# Patient Record
Sex: Female | Born: 1935 | Race: White | Hispanic: No | Smoking: Former smoker
Health system: Southern US, Community
[De-identification: ages and names within clinical notes are randomized; demographics above are authoritative.]

## PROBLEM LIST (undated history)

## (undated) DIAGNOSIS — I2510 Atherosclerotic heart disease of native coronary artery without angina pectoris: Secondary | ICD-10-CM

## (undated) DIAGNOSIS — I255 Ischemic cardiomyopathy: Secondary | ICD-10-CM

## (undated) DIAGNOSIS — M1990 Unspecified osteoarthritis, unspecified site: Secondary | ICD-10-CM

## (undated) DIAGNOSIS — Z90710 Acquired absence of both cervix and uterus: Secondary | ICD-10-CM

## (undated) DIAGNOSIS — I10 Essential (primary) hypertension: Secondary | ICD-10-CM

## (undated) DIAGNOSIS — Z9079 Acquired absence of other genital organ(s): Secondary | ICD-10-CM

## (undated) DIAGNOSIS — Z90722 Acquired absence of ovaries, bilateral: Secondary | ICD-10-CM

## (undated) DIAGNOSIS — I340 Nonrheumatic mitral (valve) insufficiency: Secondary | ICD-10-CM

## (undated) DIAGNOSIS — I219 Acute myocardial infarction, unspecified: Secondary | ICD-10-CM

## (undated) DIAGNOSIS — Z87891 Personal history of nicotine dependence: Secondary | ICD-10-CM

## (undated) DIAGNOSIS — I509 Heart failure, unspecified: Secondary | ICD-10-CM

## (undated) DIAGNOSIS — E785 Hyperlipidemia, unspecified: Secondary | ICD-10-CM

## (undated) HISTORY — DX: Atherosclerotic heart disease of native coronary artery without angina pectoris: I25.10

## (undated) HISTORY — DX: Ischemic cardiomyopathy: I25.5

## (undated) HISTORY — PX: CAROTID STENT INSERTION: SHX5766

## (undated) HISTORY — DX: Acute myocardial infarction, unspecified: I21.9

## (undated) HISTORY — DX: Hyperlipidemia, unspecified: E78.5

## (undated) HISTORY — DX: Nonrheumatic mitral (valve) insufficiency: I34.0

## (undated) HISTORY — DX: Unspecified osteoarthritis, unspecified site: M19.90

## (undated) HISTORY — PX: TOTAL ABDOMINAL HYSTERECTOMY W/ BILATERAL SALPINGOOPHORECTOMY: SHX83

## (undated) HISTORY — DX: Personal history of nicotine dependence: Z87.891

## (undated) HISTORY — DX: Acquired absence of ovaries, bilateral: Z90.722

## (undated) HISTORY — DX: Essential (primary) hypertension: I10

## (undated) HISTORY — DX: Acquired absence of other genital organ(s): Z90.79

## (undated) HISTORY — DX: Acquired absence of both cervix and uterus: Z90.710

---

## 2002-09-26 ENCOUNTER — Ambulatory Visit (HOSPITAL_COMMUNITY): Admission: RE | Admit: 2002-09-26 | Discharge: 2002-09-26 | Payer: Self-pay | Admitting: Emergency Medicine

## 2002-09-26 ENCOUNTER — Encounter: Payer: Self-pay | Admitting: Emergency Medicine

## 2002-09-28 ENCOUNTER — Other Ambulatory Visit: Admission: RE | Admit: 2002-09-28 | Discharge: 2002-09-28 | Payer: Self-pay | Admitting: *Deleted

## 2002-10-05 ENCOUNTER — Ambulatory Visit (HOSPITAL_COMMUNITY): Admission: RE | Admit: 2002-10-05 | Discharge: 2002-10-05 | Payer: Self-pay | Admitting: *Deleted

## 2002-10-11 ENCOUNTER — Encounter (INDEPENDENT_AMBULATORY_CARE_PROVIDER_SITE_OTHER): Payer: Self-pay | Admitting: Specialist

## 2002-10-11 ENCOUNTER — Inpatient Hospital Stay (HOSPITAL_COMMUNITY): Admission: RE | Admit: 2002-10-11 | Discharge: 2002-10-13 | Payer: Self-pay | Admitting: *Deleted

## 2009-11-03 ENCOUNTER — Inpatient Hospital Stay (HOSPITAL_COMMUNITY): Admission: EM | Admit: 2009-11-03 | Discharge: 2009-11-07 | Payer: Self-pay | Admitting: Emergency Medicine

## 2009-11-03 ENCOUNTER — Ambulatory Visit: Payer: Self-pay | Admitting: Cardiology

## 2009-11-04 ENCOUNTER — Encounter (INDEPENDENT_AMBULATORY_CARE_PROVIDER_SITE_OTHER): Payer: Self-pay | Admitting: Cardiology

## 2009-11-06 ENCOUNTER — Encounter: Payer: Self-pay | Admitting: Cardiology

## 2009-11-08 ENCOUNTER — Encounter: Payer: Self-pay | Admitting: Cardiology

## 2009-11-14 ENCOUNTER — Encounter: Payer: Self-pay | Admitting: Cardiology

## 2009-11-28 DIAGNOSIS — I080 Rheumatic disorders of both mitral and aortic valves: Secondary | ICD-10-CM

## 2009-11-28 DIAGNOSIS — I5021 Acute systolic (congestive) heart failure: Secondary | ICD-10-CM | POA: Insufficient documentation

## 2009-11-28 DIAGNOSIS — I214 Non-ST elevation (NSTEMI) myocardial infarction: Secondary | ICD-10-CM | POA: Insufficient documentation

## 2009-11-28 DIAGNOSIS — M1990 Unspecified osteoarthritis, unspecified site: Secondary | ICD-10-CM | POA: Insufficient documentation

## 2009-11-28 DIAGNOSIS — Z87891 Personal history of nicotine dependence: Secondary | ICD-10-CM

## 2009-11-28 DIAGNOSIS — I10 Essential (primary) hypertension: Secondary | ICD-10-CM

## 2009-11-28 DIAGNOSIS — I2589 Other forms of chronic ischemic heart disease: Secondary | ICD-10-CM

## 2009-11-29 ENCOUNTER — Ambulatory Visit: Payer: Self-pay | Admitting: Cardiology

## 2009-11-29 DIAGNOSIS — I2510 Atherosclerotic heart disease of native coronary artery without angina pectoris: Secondary | ICD-10-CM

## 2009-11-29 DIAGNOSIS — E785 Hyperlipidemia, unspecified: Secondary | ICD-10-CM

## 2009-11-29 LAB — CONVERTED CEMR LAB
Calcium: 9.6 mg/dL (ref 8.4–10.5)
Creatinine, Ser: 0.9 mg/dL (ref 0.4–1.2)
Pro B Natriuretic peptide (BNP): 148.5 pg/mL — ABNORMAL HIGH (ref 0.0–100.0)

## 2009-12-13 ENCOUNTER — Ambulatory Visit: Payer: Self-pay | Admitting: Cardiology

## 2009-12-17 LAB — CONVERTED CEMR LAB
Calcium: 9.3 mg/dL (ref 8.4–10.5)
GFR calc non Af Amer: 54.02 mL/min (ref >60)
Sodium: 140 meq/L (ref 135–145)

## 2009-12-26 ENCOUNTER — Ambulatory Visit: Payer: Self-pay | Admitting: Cardiology

## 2010-01-09 ENCOUNTER — Ambulatory Visit: Payer: Self-pay | Admitting: Cardiology

## 2010-01-18 ENCOUNTER — Encounter: Payer: Self-pay | Admitting: Cardiology

## 2010-01-18 LAB — CONVERTED CEMR LAB
ALT: 17 units/L (ref 0–35)
Albumin: 3.9 g/dL (ref 3.5–5.2)
Alkaline Phosphatase: 62 units/L (ref 39–117)
Chloride: 103 meq/L (ref 96–112)
Cholesterol: 132 mg/dL (ref 0–200)
Creatinine, Ser: 1 mg/dL (ref 0.4–1.2)
LDL Cholesterol: 67 mg/dL (ref 0–99)
Potassium: 4.3 meq/L (ref 3.5–5.1)
Total Protein: 6.2 g/dL (ref 6.0–8.3)
Triglycerides: 137 mg/dL (ref 0.0–149.0)

## 2010-02-20 ENCOUNTER — Ambulatory Visit: Payer: Self-pay | Admitting: Cardiology

## 2010-02-20 ENCOUNTER — Ambulatory Visit (HOSPITAL_COMMUNITY): Admission: RE | Admit: 2010-02-20 | Discharge: 2010-02-20 | Payer: Self-pay | Admitting: Cardiology

## 2010-02-20 ENCOUNTER — Ambulatory Visit: Payer: Self-pay

## 2010-03-27 ENCOUNTER — Ambulatory Visit: Payer: Self-pay | Admitting: Cardiology

## 2010-04-02 LAB — CONVERTED CEMR LAB
Calcium: 9.3 mg/dL (ref 8.4–10.5)
GFR calc non Af Amer: 53.99 mL/min (ref >60)
Glucose, Bld: 88 mg/dL (ref 70–99)
Potassium: 4.6 meq/L (ref 3.5–5.1)
Sodium: 137 meq/L (ref 135–145)

## 2010-07-09 NOTE — Assessment & Plan Note (Signed)
Summary: per check out/sf   Primary Provider:  Dr. Cleta Alberts  CC:  follow up.  Pt states she is feeling okay.  Cardiac rehab starting in September.  History of Present Illness: 77 yo with history of recent late-presentation anterior MI presents for hospital followup.  Patient was admitted in 5/11 after having about 2 weeks of on and off severe chest pain.  She was found to have a 95% proximal LAD stenosis on cath with akinetic myocardium in the LAD distribution.  She had a PROMUS DES to the LAD and was treated for systolic CHF.  Since going back home, she has been doing well.  She is living alone again though her family lives close and checks in.  She is doing her shopping, cooking, and cleaning.  She walks for about 20 minutes a day. She denies shortness of breath with these activities.  She has had no further chest pain.  She plans to start cardiac rehab in 9/11 after a family vacation. BP is mildly elevated today but has been running in the 110s-130s systolic at home.    Labs (6/11): K 4.7, creatinine 0.9, LDL 128, HDL 38, BNP 149 Labs (7/11): K 4.7, creatinine 1.0  ECG: NSR at 57, TW inversions V1 and V2  Current Medications (verified): 1)  Carvedilol 12.5 Mg Tabs (Carvedilol) .... Take One Tablet By Mouth Twice A Day 2)  Plavix 75 Mg Tabs (Clopidogrel Bisulfate) .... Take One Tablet By Mouth Daily 3)  Enalapril Maleate 5 Mg Tabs (Enalapril Maleate) .... One and One Half Tablet By Mouth Two Times A Day 4)  Furosemide 20 Mg Tabs (Furosemide) .Marland Kitchen.. 1 Tab By Mouth Every Other Day 5)  Nitrostat 0.4 Mg Subl (Nitroglycerin) .Marland Kitchen.. 1 Tablet Under Tongue At Onset of Chest Pain; You May Repeat Every 5 Minutes For Up To 3 Doses. 6)  Potassium Chloride Crys Cr 20 Meq Cr-Tabs (Potassium Chloride Crys Cr) .... 1/2 Tab By Mouth Once Daily 7)  Spironolactone 25 Mg Tabs (Spironolactone) .... Take One Tablet By Mouth Daily 8)  Aspirin Ec 325 Mg Tbec (Aspirin) .... Take One Tablet By Mouth Daily 9)  Crestor 20 Mg  Tabs (Rosuvastatin Calcium) .... Take One Tablet By Mouth Daily.  Allergies (verified): No Known Drug Allergies  Past History:  Past Medical History: Reviewed history from 11/29/2009 and no changes required. 1. CAD: Patient had an out of hospital (late presentation) anterior MI in 5/11.  LHC showed 95% proximal LAD stenosis.  Patient had a PROMUS DES to the LAD.  2. Ischemic cardiomyopathy: Echo (5/11) showed EF 30% with anterior, anteroseptal, inferoseptal, and apex akinesis.  There was restrictive diastolic function, moderate mitral regurgitation, and PA systolic pressure 59 mmHg.   3. Remote tobacco abuse.  4. Hypertension.  5. Osteoarthritis  6. Moderate mitral regurgitation by echo 5/11.  7. Hyperlipidemia 8. s/p TAH-BSO  Family History: Reviewed history from 11/28/2009 and no changes required.  Noncontributory.   Social History: Reviewed history from 11/29/2009 and no changes required. The patient lives in [place] at [place].  She is widowed and lives alone though her children are near.  .  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  She does not  drink any alcohol.   Review of Systems       All systems reviewed and negative except as per HPI.   Vital Signs:  Patient profile:   77 year old female Height:      99  inches Weight:      124 pounds BMI:     20.09 Pulse rate:   57 / minute Pulse rhythm:   regular BP sitting:   142 / 70  (left arm) Cuff size:   regular  Vitals Entered By: Judithe Modest CMA (December 26, 2009 9:24 AM)  Physical Exam  General:  Elderly woman in no apparent distress Neck:  Neck supple, no JVD. No masses, thyromegaly or abnormal cervical nodes. Lungs:  Clear bilaterally to auscultation and percussion. Heart:  Non-displaced PMI, chest non-tender; regular rate and rhythm, S1, S2 without murmurs, rubs. +S4. Carotid upstroke normal, no bruit.  Pedals normal pulses. No edema, no varicosities. Abdomen:  Bowel sounds  positive; abdomen soft and non-tender without masses, organomegaly, or hernias noted. No hepatosplenomegaly. Extremities:  No clubbing or cyanosis. Neurologic:  Alert and oriented x 3. Psych:  Normal affect.   Impression & Recommendations:  Problem # 1:  CAD, NATIVE VESSEL (ICD-414.01) Status post late presentation anterior MI with resulting akinetic myocardium in the LAD distribution.  S/p PROMUS DES to the LAD.  No chest pain since PCI.  She seems to be doing well at home.  - She will start cardiac rehab after a family vacation to the beach.  - Continue ASA, Plavix, statin, beta blocker, ACEI  Problem # 2:  HYPERLIPIDEMIA-MIXED (ICD-272.4) Lipids/LFTs in 8/11 with goal LDL < 70.   Problem # 3:  ISCHEMIC CARDIOMYOPATHY (ICD-414.8) EF 30% by echo in hospital.  Today she appears euvolemic with NYHA II symptoms.  Continue current Lasix and spironolactone.  She can stop KCl.  I will increase enalapril to 10 mg bid.  BMET in 2 wks. Continue Coreg and spironolactone.   Echo in 3 months post-PCI for LV function on meds.  Would avoid ICD given age.   Patient Instructions: 1)  Your physician has recommended you make the following change in your medication:  2)  Increase Enalapril to 10mg  twice a day--you can take two 5mg  tablets twice a day. 3)  Stop Potassium(KCL). 4)  Your physician recommends that you return for a FASTING lipid profile/liver profile/BMP in 2 weeks--414.01 428.22  5)  Your physician has requested that you have an echocardiogram.  Echocardiography is a painless test that uses sound waves to create images of your heart. It provides your doctor with information about the size and shape of your heart and how well your heart's chambers and valves are working.  This procedure takes approximately one hour. There are no restrictions for this procedure. February 20, 2010 11:30 am 6)  Your physician wants you to follow-up in: 3 months with Dr Shirlee Latch. You will receive a reminder letter in  the mail two months in advance. If you don't receive a letter, please call our office to schedule the follow-up appointment. Prescriptions: ENALAPRIL MALEATE 10 MG TABS (ENALAPRIL MALEATE) one twice a day  #60 x 11   Entered by:   Katina Dung, RN, BSN   Authorized by:   Marca Ancona, MD   Signed by:   Katina Dung, RN, BSN on 12/26/2009   Method used:   Electronically to        [facility]. [phone]* (retail)       [address]       [place], [place]  [address]       Ph: [phone]       Fax: [phone]   RxID:   [id number]

## 2010-07-09 NOTE — Cardiovascular Report (Signed)
Summary: [facility]   Imported By: Roderic Ovens 12/04/2009 14:05:27  _____________________________________________________________________  External Attachment:    Type:   Image     Comment:   External Document

## 2010-07-09 NOTE — Assessment & Plan Note (Signed)
Summary: f19m   Visit Type:  Follow-up Primary Provider:  Dr. Cleta Alberts  CC:  no complaints.  History of Present Illness: 77 yo with history of recent late-presentation anterior MI presents for hospital followup.  Patient was admitted in 5/11 after having about 2 weeks of on and off severe chest pain.  She was found to have a 95% proximal LAD stenosis on cath with akinetic myocardium in the LAD distribution.  She had a PROMUS DES to the LAD and was treated for systolic CHF.    Since going back home, she has been doing well.  She is living alone though her family lives close and checks in.  She is doing her shopping, cooking, and cleaning.  She does all the housework as well as some yardwork. She denies shortness of breath with these activities.  She goes grocery shopping and drives.  She is somewhat limited by ankle and knee pain.  Repeat echo last month showed that EF had normalized.   Labs (6/11): K 4.7, creatinine 0.9, LDL 128, HDL 38, BNP 149 Labs (7/11): K 4.7, creatinine 1.0 Labs (8/11): K 4.3, creatinine 1.0, LDL 67, HDL 38, LFTs normal  Current Medications (verified): 1)  Carvedilol 12.5 Mg Tabs (Carvedilol) .... Take One Tablet By Mouth Twice A Day 2)  Plavix 75 Mg Tabs (Clopidogrel Bisulfate) .... Take One Tablet By Mouth Daily 3)  Enalapril Maleate 10 Mg Tabs (Enalapril Maleate) .... One Twice A Day 4)  Furosemide 20 Mg Tabs (Furosemide) .Marland Kitchen.. 1 Tab By Mouth Every Other Day 5)  Nitrostat 0.4 Mg Subl (Nitroglycerin) .Marland Kitchen.. 1 Tablet Under Tongue At Onset of Chest Pain; You May Repeat Every 5 Minutes For Up To 3 Doses. 6)  Spironolactone 25 Mg Tabs (Spironolactone) .... Take One Tablet By Mouth Daily 7)  Aspirin Ec 325 Mg Tbec (Aspirin) .... Take One Tablet By Mouth Daily 8)  Crestor 20 Mg Tabs (Rosuvastatin Calcium) .... Take One Tablet By Mouth Daily.  Allergies (verified): No Known Drug Allergies  Past History:  Past Medical History: 1. CAD: Patient had an out of hospital (late  presentation) anterior MI in 5/11.  LHC showed 95% proximal LAD stenosis.  Patient had a PROMUS DES to the LAD.  2. Ischemic cardiomyopathy: Echo (5/11) showed EF 30% with anterior, anteroseptal, inferoseptal, and apex akinesis.  There was restrictive diastolic function, moderate mitral regurgitation, and PA systolic pressure 59 mmHg.  Repeat echo (9/11) with EF 60%, mild LVH, no regional wall motion abnormalities, PA systolic pressure 32 mmHg, mild MR.   3. Remote tobacco abuse.  4. Hypertension.  5. Osteoarthritis  6. Moderate mitral regurgitation by echo 5/11.  7. Hyperlipidemia 8. s/p TAH-BSO  Family History: Reviewed history from 11/28/2009 and no changes required.  Noncontributory.   Social History: Reviewed history from 11/29/2009 and no changes required. The patient lives in [place] at [place].  She is widowed and lives alone though her children are near.  .  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  She does not  drink any alcohol.   Vital Signs:  Patient profile:   77 year old female Height:      66 inches Weight:      124 pounds BMI:     20.09 Pulse rate:   72 / minute Pulse rhythm:   regular BP sitting:   140 / 68  (right arm) Cuff size:   regular  Vitals Entered By: Hardin Negus, RMA (March 27, 2010 10:29 AM)  Physical Exam  General:  Elderly woman in no apparent distress Neck:  Neck supple, no JVD. No masses, thyromegaly or abnormal cervical nodes. Lungs:  Clear bilaterally to auscultation and percussion. Heart:  Non-displaced PMI, chest non-tender; regular rate and rhythm, S1, S2 without murmurs, rubs. +S4. Carotid upstroke normal, no bruit.  Pedals normal pulses. No edema, no varicosities. Abdomen:  Bowel sounds positive; abdomen soft and non-tender without masses, organomegaly, or hernias noted. No hepatosplenomegaly. Extremities:  No clubbing or cyanosis. Neurologic:  Alert and oriented x 3. Psych:  Normal  affect.   Impression & Recommendations:  Problem # 1:  CAD, NATIVE VESSEL (ICD-414.01) Status post late presentation anterior MI with resulting akinetic myocardium in the LAD distribution.  S/p PROMUS DES to the LAD.  No chest pain since PCI.  She seems to be doing well at home.  - She is now thinking about doing cardiac rehab: I will try to get her set up with this.  - Continue ASA, Plavix (for 1 year), statin, beta blocker, ACEI  Problem # 2:  CONGESTIVE HEART FAILURE, ACUTE, SYSTOLIC (ICD-428.21) Followup echo showed normalized EF.  She is doing well symptomatically as well.  Continue current meds for now.  BMET today.   Problem # 3:  HYPERLIPIDEMIA-MIXED (ICD-272.4) LDL at goal (< 70).   Other Orders: TLB-BMP (Basic Metabolic Panel-BMET) (80048-METABOL)  Patient Instructions: 1)  Your physician recommends that you schedule a follow-up appointment in: 6 months 2)  Your physician recommends that you return for lab work in:  today--BMET

## 2010-07-09 NOTE — Letter (Signed)
Summary: Custom - Lipid  [facility], Main Office  [address]   [place], [place] [address]   Phone: [phone]  Fax: [phone]     January 18, 2010 MRN: [id number]   [facility] [address] [place], [place]  [address]   Dear Ms. Michna,  We have reviewed your cholesterol results.  They are as follows:     Total Cholesterol:    132 (Desirable: less than 200)       HDL  Cholesterol:     37.70  (Desirable: greater than 40 for men and 50 for women)       LDL Cholesterol:       67  (Desirable: less than 100 for low risk and less than 70 for moderate to high risk)       Triglycerides:       137.0  (Desirable: less than 150)  Our recommendations include: these results have been reviewed by Dr Shirlee Latch and there are no new recommendations.   Call our office at the number listed above if you have any questions.  Lowering your LDL cholesterol is important, but it is only one of a large number of "risk factors" that may indicate that you are at risk for heart disease, stroke or other complications of hardening of the arteries.  Other risk factors include:   A.  Cigarette Smoking* B.  High Blood Pressure* C.  Obesity* D.   Low HDL Cholesterol (see yours above)* E.   Diabetes Mellitus (higher risk if your is uncontrolled) F.  Family history of premature heart disease G.  Previous history of stroke or cardiovascular disease    *These are risk factors YOU HAVE CONTROL OVER.  For more information, visit .  There is now evidence that lowering the TOTAL CHOLESTEROL AND LDL CHOLESTEROL can reduce the risk of heart disease.  The American Heart Association recommends the following guidelines for the treatment of elevated cholesterol:  1.  If there is now current heart disease and less than two risk factors, TOTAL CHOLESTEROL should be less than 200 and LDL CHOLESTEROL should be less than 100. 2.  If there is current heart disease or two or more risk factors,  TOTAL CHOLESTEROL should be less than 200 and LDL CHOLESTEROL should be less than 70.  A diet low in cholesterol, saturated fat, and calories is the cornerstone of treatment for elevated cholesterol.  Cessation of smoking and exercise are also important in the management of elevated cholesterol and preventing vascular disease.  Studies have shown that 30 to 60 minutes of physical activity most days can help lower blood pressure, lower cholesterol, and keep your weight at a healthy level.  Drug therapy is used when cholesterol levels do not respond to therapeutic lifestyle changes (smoking cessation, diet, and exercise) and remains unacceptably high.  If medication is started, it is important to have you levels checked periodically to evaluate the need for further treatment options.  Thank you,    [employer] Team

## 2010-07-09 NOTE — Miscellaneous (Signed)
Summary: Medical Referral/Physician Order  Medical Referral/Physician Order   Imported By: Erle Crocker 11/22/2009 07:48:19  _____________________________________________________________________  External Attachment:    Type:   Image     Comment:   External Document

## 2010-07-09 NOTE — Miscellaneous (Signed)
Summary: [facility] Cardiac Progress Note   [facility] Cardiac [facility]   Imported By: Roderic Ovens 11/26/2009 14:05:50  _____________________________________________________________________  External Attachment:    Type:   Image     Comment:   External Document

## 2010-07-09 NOTE — Assessment & Plan Note (Signed)
Summary: EPH/JML   Primary Provider:  Dr. Cleta Alberts  CC:  follow hospital visit.  History of Present Illness: 77 yo with history of recent late-presentation anterior MI presents for hospital followup.  Patient was admitted in 5/11 after having about 2 weeks of on and off severe chest pain.  She was found to have a 95% proximal LAD stenosis on cath with akinetic myocardium in the LAD distribution.  She had a PROMUS DES to the LAD and was treated for systolic CHF.  Since going back home, she has been doing well.  She is living alone again though her family lives close and checks in.  She is doing her shopping, cooking, and cleaning.  She walks for about 20 minutes a day. She denies shortness of breath with these activities.  She has had no further chest pain.    Labs (6/11): K 3.8, creatinine 0.89, LDL 128, HDL 38  Current Medications (verified): 1)  Carvedilol 6.25 Mg Tabs (Carvedilol) .Marland Kitchen.. 1 1/2 Tab By Mouth Two Times A Day 2)  Plavix 75 Mg Tabs (Clopidogrel Bisulfate) .... Take One Tablet By Mouth Daily 3)  Enalapril Maleate 5 Mg Tabs (Enalapril Maleate) .... Take One Tablet By Mouth Twice A Day 4)  Furosemide 20 Mg Tabs (Furosemide) .Marland Kitchen.. 1 Tab By Mouth Every Other Day 5)  Nitrostat 0.4 Mg Subl (Nitroglycerin) .Marland Kitchen.. 1 Tablet Under Tongue At Onset of Chest Pain; You May Repeat Every 5 Minutes For Up To 3 Doses. 6)  Potassium Chloride Crys Cr 20 Meq Cr-Tabs (Potassium Chloride Crys Cr) .... 1/2 Tab By Mouth Once Daily 7)  Spironolactone 25 Mg Tabs (Spironolactone) .... Take One Tablet By Mouth Daily 8)  Aspirin Ec 325 Mg Tbec (Aspirin) .... Take One Tablet By Mouth Daily 9)  Crestor 20 Mg Tabs (Rosuvastatin Calcium) .... Take One Tablet By Mouth Daily.  Past History:  Past Medical History: 1. CAD: Patient had an out of hospital (late presentation) anterior MI in 5/11.  LHC showed 95% proximal LAD stenosis.  Patient had a PROMUS DES to the LAD.  2. Ischemic cardiomyopathy: Echo (5/11) showed EF  30% with anterior, anteroseptal, inferoseptal, and apex akinesis.  There was restrictive diastolic function, moderate mitral regurgitation, and PA systolic pressure 59 mmHg.   3. Remote tobacco abuse.  4. Hypertension.  5. Osteoarthritis  6. Moderate mitral regurgitation by echo 5/11.  7. Hyperlipidemia 8. s/p TAH-BSO  Family History: Reviewed history from 11/28/2009 and no changes required.  Noncontributory.   Social History: The patient lives in [place] at [place].  She is widowed and lives alone though her children are near.  .  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  She does not  drink any alcohol.   Review of Systems       All systems reviewed and negative except as per HPI.   Vital Signs:  Patient profile:   77 year old female Height:      66 inches Weight:      124 pounds BMI:     20.09 Pulse rate:   72 / minute Resp:     14 per minute BP sitting:   152 / 80  (left arm)  Vitals Entered By: Kem Parkinson (November 29, 2009 9:11 AM)  Physical Exam  General:  Elderly woman in no apparent distress Head:  normocephalic and atraumatic Nose:  no deformity, discharge, inflammation, or lesions Mouth:  Teeth, gums and palate normal. Oral mucosa  normal. Neck:  Neck supple, no JVD. No masses, thyromegaly or abnormal cervical nodes. Lungs:  Clear bilaterally to auscultation and percussion. Heart:  Non-displaced PMI, chest non-tender; regular rate and rhythm, S1, S2 without murmurs, rubs. +S4. Carotid upstroke normal, no bruit.  Pedals normal pulses. No edema, no varicosities. Abdomen:  Bowel sounds positive; abdomen soft and non-tender without masses, organomegaly, or hernias noted. No hepatosplenomegaly. Extremities:  No clubbing or cyanosis. Neurologic:  Alert and oriented x 3. Skin:  Intact without lesions or rashes. Psych:  Normal affect.   Impression & Recommendations:  Problem # 1:  CAD, NATIVE VESSEL (ICD-414.01) Status post late  presentation anterior MI with resulting akinetic myocardium in the LAD distribution.  S/p PROMUS DES to the LAD.  No chest pain since PCI.  She seems to be doing well at home.  - I encouraged her to do cardiac rehab - Continue ASA, Plavix, statin, beta blocker, ACEI  Problem # 2:  ISCHEMIC CARDIOMYOPATHY (ICD-414.8) EF 30% by echo in hospital.  Today she appears euvolemic with NYHA II symptoms.  Continue current Lasix and spironolactone.  Will increase Coreg to 12.5 mg two times a day and enalapril to 7.5 mg two times a day.  BMET in 2 wks.  Echo in 3 months post-PCI for LV function.  ? ICD if EF < 35%.   Problem # 3:  HYPERTENSION (ICD-401.9) Elevated BP.  Will increase Coreg and enalapril as above.  BP check in 2 wks.   Problem # 4:  HYPERLIPIDEMIA-MIXED (ICD-272.4) Lipids/LFTs in 8/11 with goal LDL < 70.   Other Orders: Cardiac Rehabilitation (Cardiac Rehab) Echocardiogram (Echo) TLB-BMP (Basic Metabolic Panel-BMET) (80048-METABOL) TLB-BNP (B-Natriuretic Peptide) (83880-BNPR)  Patient Instructions: 1)  Your physician recommends that you schedule a follow-up appointment in: ONE MONTH 2)  Your physician recommends that you return for lab work in:2 WEEKS-BMP-428.22/V58.69 3)  Your physician has recommended you make the following change in your medication: INCREASE CARVEDALOL 12.5MG  TWICE DAILY 4)  INCREASE ENALAPRIL 5MG  ONE AND ONE HALF TABLET TWICE DAILY 5)  Your physician has requested that you have an echocardiogram.  Echocardiography is a painless test that uses sound waves to create images of your heart. It provides your doctor with information about the size and shape of your heart and how well your heart's chambers and valves are working.  This procedure takes approximately one hour. There are no restrictions for this procedure.SCHEDULE IN 3 MONTHS 6)  CARDIAC REHAB Prescriptions: ENALAPRIL MALEATE 5 MG TABS (ENALAPRIL MALEATE) ONE AND ONE HALF TABLET by mouth two times a day  #90 x  12   Entered by:   Deliah Goody, RN   Authorized by:   Marca Ancona, MD   Signed by:   Deliah Goody, RN on 11/29/2009   Method used:   Electronically to        [facility]. [phone]* (retail)       [address]       [place], [place]  [address]       Ph: [phone]       Fax: [phone]   RxID:   [id number] CARVEDILOL 12.5 MG TABS (CARVEDILOL) Take one tablet by mouth twice a day  #60 x 12   Entered by:   Deliah Goody, RN   Authorized by:   Marca Ancona, MD   Signed by:   Deliah Goody, RN on 11/29/2009   Method used:   Electronically to  [facility] [address] [phone]* (retail)       [address]       [place], [place]  [address]       Ph: [phone]       Fax: [phone]   RxID:   [id number]

## 2010-08-26 LAB — BASIC METABOLIC PANEL WITH GFR
BUN: 18 mg/dL (ref 6–23)
CO2: 24 meq/L (ref 19–32)
Calcium: 9 mg/dL (ref 8.4–10.5)
Chloride: 110 meq/L (ref 96–112)
Creatinine, Ser: 0.89 mg/dL (ref 0.4–1.2)
GFR calc Af Amer: >60 mL/min (ref >60)
GFR calc non Af Amer: >60 mL/min (ref >60)
Glucose, Bld: 95 mg/dL (ref 70–99)
Potassium: 3.8 meq/L (ref 3.5–5.1)
Sodium: 139 meq/L (ref 135–145)

## 2010-08-26 LAB — CBC
HCT: 36.9 % (ref 36.0–46.0)
HCT: 38.6 % (ref 36.0–46.0)
HCT: 40.8 % (ref 36.0–46.0)
Hemoglobin: 12.4 g/dL (ref 12.0–15.0)
Hemoglobin: 13.3 g/dL (ref 12.0–15.0)
Hemoglobin: 13.4 g/dL (ref 12.0–15.0)
MCHC: 33.5 g/dL (ref 30.0–36.0)
MCHC: 34.5 g/dL (ref 30.0–36.0)
MCHC: 34.6 g/dL (ref 30.0–36.0)
MCV: 93.4 fL (ref 78.0–100.0)
MCV: 93.4 fL (ref 78.0–100.0)
Platelets: 208 K/uL (ref 150–400)
Platelets: 207 10*3/uL (ref 150–400)
Platelets: 212 10*3/uL (ref 150–400)
Platelets: 217 10*3/uL (ref 150–400)
RBC: 3.96 MIL/uL (ref 3.87–5.11)
RBC: 4.14 MIL/uL (ref 3.87–5.11)
RBC: 4.37 MIL/uL (ref 3.87–5.11)
RDW: 15.1 % (ref 11.5–15.5)
RDW: 14.6 % (ref 11.5–15.5)
RDW: 15 % (ref 11.5–15.5)
RDW: 15.1 % (ref 11.5–15.5)
WBC: 8.8 K/uL (ref 4.0–10.5)
WBC: 10.2 10*3/uL (ref 4.0–10.5)
WBC: 9.9 10*3/uL (ref 4.0–10.5)

## 2010-08-26 LAB — BASIC METABOLIC PANEL
BUN: 15 mg/dL (ref 6–23)
BUN: 20 mg/dL (ref 6–23)
CO2: 26 mEq/L (ref 19–32)
CO2: 28 mEq/L (ref 19–32)
CO2: 29 mEq/L (ref 19–32)
Calcium: 8.7 mg/dL (ref 8.4–10.5)
Calcium: 9 mg/dL (ref 8.4–10.5)
Calcium: 9 mg/dL (ref 8.4–10.5)
Calcium: 9 mg/dL (ref 8.4–10.5)
Creatinine, Ser: 0.82 mg/dL (ref 0.4–1.2)
Creatinine, Ser: 1 mg/dL (ref 0.4–1.2)
GFR calc Af Amer: >60 mL/min (ref >60)
GFR calc Af Amer: >60 mL/min (ref >60)
GFR calc non Af Amer: >60 mL/min (ref >60)
GFR calc non Af Amer: >60 mL/min (ref >60)
Glucose, Bld: 100 mg/dL — ABNORMAL HIGH (ref 70–99)
Glucose, Bld: 105 mg/dL — ABNORMAL HIGH (ref 70–99)
Glucose, Bld: 128 mg/dL — ABNORMAL HIGH (ref 70–99)
Glucose, Bld: 98 mg/dL (ref 70–99)
Potassium: 3.3 mEq/L — ABNORMAL LOW (ref 3.5–5.1)
Sodium: 141 mEq/L (ref 135–145)

## 2010-08-26 LAB — POCT CARDIAC MARKERS
Myoglobin, poc: 58.1 ng/mL (ref 12–200)
Myoglobin, poc: 71.7 ng/mL (ref 12–200)

## 2010-08-26 LAB — POCT I-STAT, CHEM 8
BUN: 13 mg/dL (ref 6–23)
Calcium, Ion: 0.82 mmol/L — ABNORMAL LOW (ref 1.12–1.32)
Creatinine, Ser: 0.8 mg/dL (ref 0.4–1.2)
Hemoglobin: 14.3 g/dL (ref 12.0–15.0)
Sodium: 140 mEq/L (ref 135–145)
TCO2: 25 mmol/L (ref 0–100)

## 2010-08-26 LAB — CARDIAC PANEL(CRET KIN+CKTOT+MB+TROPI)
CK, MB: 3.6 ng/mL (ref 0.3–4.0)
CK, MB: 4.6 ng/mL — ABNORMAL HIGH (ref 0.3–4.0)
CK, MB: 6.2 ng/mL (ref 0.3–4.0)
Relative Index: INVALID (ref 0.0–2.5)
Relative Index: INVALID (ref 0.0–2.5)
Total CK: 64 U/L (ref 7–177)
Total CK: 67 U/L (ref 7–177)
Total CK: 73 U/L (ref 7–177)

## 2010-08-26 LAB — POCT I-STAT 3, ART BLOOD GAS (G3+)
Acid-Base Excess: 6 mmol/L — ABNORMAL HIGH (ref 0.0–2.0)
O2 Saturation: 92 %
pO2, Arterial: 56 mmHg — ABNORMAL LOW (ref 80.0–100.0)

## 2010-08-26 LAB — LIPID PANEL
LDL Cholesterol: 128 mg/dL — ABNORMAL HIGH (ref 0–99)
Triglycerides: 100 mg/dL (ref <150)
VLDL: 20 mg/dL (ref 0–40)

## 2010-08-26 LAB — DIFFERENTIAL
Basophils Relative: 0 % (ref 0–1)
Eosinophils Absolute: 0 10*3/uL (ref 0.0–0.7)
Eosinophils Relative: 0 % (ref 0–5)
Monocytes Relative: 6 % (ref 3–12)
Neutrophils Relative %: 83 % — ABNORMAL HIGH (ref 43–77)

## 2010-08-26 LAB — CK TOTAL AND CKMB (NOT AT ARMC)
CK, MB: 2.6 ng/mL (ref 0.3–4.0)
Relative Index: INVALID (ref 0.0–2.5)
Total CK: 72 U/L (ref 7–177)

## 2010-08-26 LAB — MRSA PCR SCREENING: MRSA by PCR: NEGATIVE

## 2010-08-26 LAB — POCT I-STAT 3, VENOUS BLOOD GAS (G3P V)
Acid-Base Excess: 2 mmol/L (ref 0.0–2.0)
O2 Saturation: 60 %
pCO2, Ven: 36.4 mmHg — ABNORMAL LOW (ref 45.0–50.0)

## 2010-08-26 LAB — MAGNESIUM: Magnesium: 2.1 mg/dL (ref 1.5–2.5)

## 2010-08-26 LAB — PROTIME-INR
INR: 1.12 (ref 0.00–1.49)
Prothrombin Time: 13.6 seconds (ref 11.6–15.2)
Prothrombin Time: 14.3 seconds (ref 11.6–15.2)

## 2010-08-26 LAB — BRAIN NATRIURETIC PEPTIDE: Pro B Natriuretic peptide (BNP): 1270 pg/mL — ABNORMAL HIGH (ref 0.0–100.0)

## 2010-08-26 LAB — HEPARIN LEVEL (UNFRACTIONATED): Heparin Unfractionated: 0.42 IU/mL (ref 0.30–0.70)

## 2010-08-26 LAB — APTT: aPTT: 29 seconds (ref 24–37)

## 2010-10-11 ENCOUNTER — Ambulatory Visit: Payer: Self-pay | Admitting: Cardiology

## 2010-10-17 ENCOUNTER — Encounter: Payer: Self-pay | Admitting: Cardiology

## 2010-10-18 ENCOUNTER — Ambulatory Visit (INDEPENDENT_AMBULATORY_CARE_PROVIDER_SITE_OTHER): Payer: Medicare Other | Admitting: Cardiology

## 2010-10-18 ENCOUNTER — Encounter: Payer: Self-pay | Admitting: Cardiology

## 2010-10-18 VITALS — BP 146/84 | HR 59 | Ht 66.0 in | Wt 127.0 lb

## 2010-10-18 DIAGNOSIS — E7800 Pure hypercholesterolemia, unspecified: Secondary | ICD-10-CM

## 2010-10-18 DIAGNOSIS — I059 Rheumatic mitral valve disease, unspecified: Secondary | ICD-10-CM

## 2010-10-18 DIAGNOSIS — E785 Hyperlipidemia, unspecified: Secondary | ICD-10-CM

## 2010-10-18 DIAGNOSIS — I2589 Other forms of chronic ischemic heart disease: Secondary | ICD-10-CM

## 2010-10-18 DIAGNOSIS — I2510 Atherosclerotic heart disease of native coronary artery without angina pectoris: Secondary | ICD-10-CM

## 2010-10-18 MED ORDER — ENALAPRIL MALEATE 5 MG PO TABS: 5.0000 mg | ORAL_TABLET | Freq: Two times a day (BID) | ORAL | Status: DC

## 2010-10-18 NOTE — Patient Instructions (Addendum)
Stop Lasix(furosemide).  Decrease Aspirin to 81mg  daily--this should be eneteric coated.  Decrease Enalapril to 5mg  twice a day. You can take 1/2 of  10mg  tablet twice a day.  Schedule an appointment for fasting lab --lipid profile / liver profile  424.0  272.0  Your physician wants you to follow-up in: 6 months with Dr Shirlee Latch.(October 2012).You will receive a reminder letter in the mail two months in advance. If you don't receive a letter, please call our office to schedule the follow-up appointment.

## 2010-10-20 NOTE — Progress Notes (Signed)
PCP: Dr. Cleta Alberts  77 yo with history of late-presentation anterior MI presents for followup.  Patient was admitted in 5/11 after having about 2 weeks of on and off severe chest pain.  She was found to have a 95% proximal LAD stenosis on cath with akinetic myocardium in the LAD distribution.  She had a PROMUS DES to the LAD and was treated for systolic CHF.  Repeat echo in the fall showed that EF had normalized.   Joanna Gay is doing quite well.  She rides her stationary bike 2-3 times a week for 30-45 minutes at a time.  No chest pain.  No exertional dyspnea.  She is concerned that her blood pressure gets down to a systolic in the 90s at times though she is not symptomatically lightheaded.  No ankle edema.  Weight is stable compared to prior appointment.   Labs (6/11): K 4.7, creatinine 0.9, LDL 128, HDL 38, BNP 149 Labs (7/11): K 4.7, creatinine 1.0 Labs (8/11): K 4.3, creatinine 1.0, LDL 67, HDL 38, LFTs normal Labs (21/30): K 4.6, creatinine 1.0  Allergies (verified):  No Known Drug Allergies  Past Medical History: 1. CAD: Patient had an out of hospital (late presentation) anterior MI in 5/11.  LHC showed 95% proximal LAD stenosis.  Patient had a PROMUS DES to the LAD.  2. Ischemic cardiomyopathy: Echo (5/11) showed EF 30% with anterior, anteroseptal, inferoseptal, and apex akinesis.  There was restrictive diastolic function, moderate mitral regurgitation, and PA systolic pressure 59 mmHg.  Repeat echo (9/11) with EF 60%, mild LVH, no regional wall motion abnormalities, PA systolic pressure 32 mmHg, mild MR.   3. Remote tobacco abuse.  4. Hypertension.  5. Osteoarthritis  6. Moderate mitral regurgitation by echo 5/11.  7. Hyperlipidemia 8. s/p TAH-BSO  Family History: Noncontributory  Social History: The patient lives in [place] at [place].  She is widowed and lives alone though her children are near.   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  She  does not  drink any alcohol.   ROS: All systems reviewed and negative except as per HPI.   Current Outpatient Prescriptions  Medication Sig Dispense Refill  . carvedilol (COREG) 12.5 MG tablet Take 12.5 mg by mouth daily.        . clopidogrel (PLAVIX) 75 MG tablet Take 75 mg by mouth daily.        . nitroGLYCERIN (NITROSTAT) 0.4 MG SL tablet Place 0.4 mg under the tongue every 5 (five) minutes as needed.        . rosuvastatin (CRESTOR) 20 MG tablet Take 20 mg by mouth daily.        Marland Kitchen spironolactone (ALDACTONE) 25 MG tablet Take 25 mg by mouth daily.        Marland Kitchen aspirin EC 81 MG tablet Take 1 tablet (81 mg total) by mouth daily.      . enalapril (VASOTEC) 5 MG tablet Take 1 tablet (5 mg total) by mouth 2 (two) times daily.  60 tablet  6    BP 146/84  Pulse 59  Ht 5\' 6"  (1.676 m)  Wt 127 lb (57.607 kg)  BMI 20.50 kg/m2 General:  Elderly woman in no apparent distress Neck:  Neck supple, no JVD. No masses, thyromegaly or abnormal cervical nodes. Lungs:  Clear bilaterally to auscultation and percussion. Heart:  Non-displaced PMI, chest non-tender; regular rate and rhythm, S1, S2 without murmurs, rubs. +S4. Carotid upstroke normal, no bruit.  Pedals normal pulses. No edema, no varicosities. Abdomen:  Bowel sounds positive; abdomen soft and non-tender without masses, organomegaly, or hernias noted. No hepatosplenomegaly. Extremities:  No clubbing or cyanosis. Neurologic:  Alert and oriented x 3. Psych:  Normal affect.

## 2010-10-20 NOTE — Assessment & Plan Note (Signed)
Check lipids/LFTs, goal LDL < 70.  

## 2010-10-20 NOTE — Assessment & Plan Note (Addendum)
Status post anterior MI with normalized EF and no ischemic symptoms.  Continue regular exercise.  Continue ASA, Plavix (at least 1 year), Coreg, ACEI, and statin.   Decrease ASA dose to 81 mg daily.

## 2010-10-20 NOTE — Assessment & Plan Note (Signed)
Followup echo after MI showed that LV systolic function, which had initially been moderately to severe decreased, had improved.  BP is on the high side today but her home readings showe SBP ranging 90s-130s.  Given the readings in the 90s, I will have her decrease enalapril to 5 mg bid (I do not want her to get lightheaded and fall).  She will continue Coreg and spironolactone at current doses.  She is euvolemic and does not feel like she needs to take Lasix.  She can stop Lasix but will need to weight daily and watch sodium intake closely.  If she begins to gain weight or develop dyspnea or lower extremity edema, she should restart Lasix every other day.

## 2010-10-23 ENCOUNTER — Other Ambulatory Visit: Payer: Medicare Other | Admitting: *Deleted

## 2010-10-25 NOTE — Op Note (Signed)
Joanna Gay, DINNING [facility]                   ACCOUNT NO.:  [id number]   MEDICAL RECORD NO.:  [id number]                   PATIENT TYPE:  INP   LOCATION:  X006                                 FACILITY:  [facility]   PHYSICIAN:  Pershing Cox, M.D.            DATE OF BIRTH:  01-24-1922   DATE OF PROCEDURE:  10/11/2002  DATE OF DISCHARGE:                                 OPERATIVE REPORT   PREOPERATIVE DIAGNOSIS:  Complex adnexal mass.   POSTOPERATIVE DIAGNOSIS:  Bilateral struma ovarii.   ANESTHESIA:  General endotracheal.   PROCEDURES:  1. Exploratory laparotomy.  2. Total abdominal hysterectomy, bilateral salpingo-oophorectomy.   SURGEON:  Pershing Cox, M.D.   ASSISTANT:  Nurse assistant Dawn.   CLINICAL HISTORY:  The patient is a 77 year old female referred from [facility]  A. Cleta Alberts, M.D., at [facility], who presented to his urgent care  facility with right-sided abdominal pain.  The pain was very severe.  He  sent her for CT scan, which showed complex adnexal masses.  She was seen in  my office and scheduled for surgery.  She has had no episodes of severe  abdominal pain since the first episodes.   FINDINGS:  Approximately 100 mL of clear yellow fluid were aspirated for  ascites.  There were bilateral adnexa masses, both approximately 12-14 cm in  size.  These were multiloculated.  There were no peritoneal implants.  The  uterus itself was normal.  The omentum was normal and the appendix was  normal in appearance.  The colon showed multiple scattered diverticula, none  looking abnormal.  The transverse colon was extremely redundant.   DESCRIPTION OF PROCEDURE:  The patient was brought to the operating room  with an IV in place.  She had PAS stockings on her lower extremities and she  had received a gram of Ancef in the holding area.  Supine on the OR table,  general endotracheal anesthesia was administered.  The patient was then  placed into a frogleg position  and anterior abdominal wall, perineum, and  vagina were prepped with a solution of Hibiclens.  Foley catheter was  sterilely inserted into the bladder.  She was draped for a midline incision  and the Bookwalter retractor was used throughout the case to give [place]  exposure.  Marcaine 0.25% was instilled into the skin beneath the umbilicus  and a skin incision was made.  Blunt dissection carried [place] down to the  fascia.  The fascia was then scored with a cautery and the rectus muscles  were separated.  The peritoneum was tented and opened atraumatically.  The  anterior abdominal walls were lifted and the fluid noted was aspirated.  Because it was 100 mL, peritoneal washings were not obtained.  Exploratory  laparotomy was performed with normal surfaces of the liver and diaphragm.  The aorta was very firm.  There were no other abnormal findings.   Lateral retractors were used to retract  the sidewalls, and a bladder blade  was placed.  Warm laps packed the bowel into the upper abdomen.   Round ligaments were grasped, suture ligated, and transected with cautery.  Long Kelly clamps were placed along the adnexal structures.  The peritoneum  was opened on each side of the pelvis along the lateral wall of the  infundibulopelvic ligament.  The ureter was palpated and a clear space was  found beneath the IP ligament.  These ligaments were clamped, cut, suture  and free-tie ligated.  Posterior peritoneum was incised, bringing the ovary  to the surface of the uterus, where it was then clamped and sent for frozen  section diagnosis.  Both of the ovaries were sent for frozen section, and  both returned as consistent with struma ovarii.   The peritoneum along the lower uterine segment was opened with cautery.  The  bladder was pushed down off of the cervix.  The uterine arteries were  skeletonized and doubly clamped and suture ligated with 0 Vicryl suture.  Masterson clamps were used to separate the lower  uterine segment and cervix  from the cardinal ligament.  Heaney clamps were placed across the  uterosacral ligaments, and these were cut, suture ligated, and held for  later in the case.  We continued the dissection down until we were beneath  the cervix.  The upper vagina was clamped and the cervix was freed from the  upper vagina.  Angled sutures were placed and tied to the uterosacral  ligaments on each side.  The vagina was closed with a single figure-of-eight  suture.  Two reefing stitches were placed in the posterior cul-de-sac to  bring the uterosacral ligaments together as this space was quite redundant.  After our procedure was completed, we carefully irrigated and inspected to  be sure of hemostasis.  Laps were removed.  The bowel was drawn down into  the omentum with the omentum over the top of the bowel.  The skin was closed  with a double-stranded looped PDS suture #1.  Skin staples were applied in  the skin.  The patient was taken to the recovery room in excellent  condition.   SPECIMENS:  Ascites, bilateral fallopian tubes and ovaries, and the uterus.   ESTIMATED BLOOD LOSS:  100 mL.   URINE OUTPUT:  350 mL.   FLUIDS REPLACED:  2100 mL.   COMPLICATIONS:  None.                                               Pershing Cox, M.D.    MAJ/MEDQ  D:  10/11/2002  T:  10/11/2002  Job:  [id number]   cc:   Brett Canales A. Cleta Alberts, M.D.  [address]  [place]  [place] [address]  Fax: [phone]

## 2010-10-25 NOTE — Discharge Summary (Signed)
   Joanna Gay, Joanna Gay [facility]                   ACCOUNT NO.:  [id number]   MEDICAL RECORD NO.:  [id number]                   PATIENT TYPE:  INP   LOCATION:  0476                                 FACILITY:  [facility]   PHYSICIAN:  Pershing Cox, M.D.            DATE OF BIRTH:  November 19, 1921   DATE OF ADMISSION:  10/11/2002  DATE OF DISCHARGE:  10/13/2002                                 DISCHARGE SUMMARY   ADMISSION DIAGNOSIS:  Complex adnexal masses.   DISCHARGE DIAGNOSIS:  Bilateral struma ovari.   HISTORY OF PRESENT ILLNESS:  For details of the patient's admission history  and physical, see the transcribed note dated Oct 11, 2002.  Basically, she is  a healthy 77 year old woman who [facility] right  upper quadrant pain.  She had a CT scan that showed complex ovarian masses,  and is here today for excision of these masses.   HOSPITAL COURSE:  On the day of admission, [name] had a  colonoscopy performed by Dr. Sherin Quarry which showed no significant  abnormalities.  She was taken to the operating room where under general  anesthesia exploratory laparotomy was performed.  She was found to have two  very large pelvic masses.  It appeared that these 10 cm masses were floating  out of the pelvis, and that the right mass had probably torsed at some time  with resolution of the tortion.  Frozen section showed bilateral struma  ovari.   The patient's hospital course was totally uncomplicated.  She recovered  well, and on the evening of her surgery had a little nausea which was  controlled by a small dose of Phenergan.  She was able to begin sips of  liquids.  On postoperative day #1, the patient's Foley catheter was  discontinued, she began on clear liquids, and was able to advance to p.o.  pain medication.  She had no complaints of nausea on postoperative day #2,  and had positive flatus.  She had a regular diet, and was able to be  discharged to home with  a prescription for Darvocet, although she had only  taken one Darvocet while in the hospital.   Final pathology on the patient's operative specimen showed bilateral struma  ovari and benign fallopian tubes.  The endometrium had simple hyperplasia  without atypia.   CONDITION ON DISCHARGE:  Stable.   DISCHARGE DIAGNOSIS:  Bilateral struma ovari.                                                Pershing Cox, M.D.    MAJ/MEDQ  D:  10/21/2002  T:  10/21/2002  Job:  [id number]

## 2010-10-30 ENCOUNTER — Other Ambulatory Visit (INDEPENDENT_AMBULATORY_CARE_PROVIDER_SITE_OTHER): Payer: Medicare Other | Admitting: *Deleted

## 2010-10-30 DIAGNOSIS — E7800 Pure hypercholesterolemia, unspecified: Secondary | ICD-10-CM

## 2010-10-30 LAB — LIPID PANEL
Cholesterol: 132 mg/dL (ref 0–200)
VLDL: 20.2 mg/dL (ref 0.0–40.0)

## 2010-10-30 LAB — HEPATIC FUNCTION PANEL
ALT: 12 U/L (ref 0–35)
Alkaline Phosphatase: 59 U/L (ref 39–117)
Bilirubin, Direct: 0.1 mg/dL (ref 0.0–0.3)
Total Protein: 6.7 g/dL (ref 6.0–8.3)

## 2010-11-07 ENCOUNTER — Telehealth: Payer: Self-pay | Admitting: Cardiology

## 2011-01-01 ENCOUNTER — Other Ambulatory Visit: Payer: Self-pay | Admitting: Cardiovascular Disease

## 2011-03-07 ENCOUNTER — Other Ambulatory Visit: Payer: Self-pay | Admitting: Cardiovascular Disease

## 2011-03-07 MED ORDER — CARVEDILOL 12.5 MG PO TABS: 12.5000 mg | ORAL_TABLET | Freq: Every day | ORAL | Status: DC

## 2011-07-17 ENCOUNTER — Other Ambulatory Visit: Payer: Self-pay | Admitting: Cardiology

## 2011-07-17 DIAGNOSIS — I059 Rheumatic mitral valve disease, unspecified: Secondary | ICD-10-CM

## 2011-07-17 NOTE — Telephone Encounter (Signed)
New Refill   Patient Daughter Joanna Gay [phone]  Called regarding patient's med refills, she also wants to know patient should schedule a f/u OV with Dr. Shirlee Latch as it has been a while since she last saw him.  Please return call to advise

## 2011-07-17 NOTE — Telephone Encounter (Signed)
I talked with pt's daughter,Joanna Gay. Pt was due to see Dr Shirlee Latch in October 2012. She will make an appt for pt with Dr Shirlee Latch for folllow-up. Pt does not need refills right now. She will contact pharmacy when she needs refills. I told pt's daughter we would refill medications when she needed them.

## 2011-07-28 ENCOUNTER — Other Ambulatory Visit: Payer: Self-pay | Admitting: Cardiovascular Disease

## 2011-08-18 ENCOUNTER — Ambulatory Visit (INDEPENDENT_AMBULATORY_CARE_PROVIDER_SITE_OTHER): Payer: Medicare Other | Admitting: Cardiology

## 2011-08-18 ENCOUNTER — Encounter: Payer: Self-pay | Admitting: Cardiology

## 2011-08-18 DIAGNOSIS — I2510 Atherosclerotic heart disease of native coronary artery without angina pectoris: Secondary | ICD-10-CM

## 2011-08-18 DIAGNOSIS — I10 Essential (primary) hypertension: Secondary | ICD-10-CM

## 2011-08-18 DIAGNOSIS — E785 Hyperlipidemia, unspecified: Secondary | ICD-10-CM

## 2011-08-18 DIAGNOSIS — I2589 Other forms of chronic ischemic heart disease: Secondary | ICD-10-CM

## 2011-08-18 LAB — LIPID PANEL: Total CHOL/HDL Ratio: 3

## 2011-08-18 LAB — BASIC METABOLIC PANEL
CO2: 28 mEq/L (ref 19–32)
Chloride: 102 mEq/L (ref 96–112)
Sodium: 135 mEq/L (ref 135–145)

## 2011-08-18 MED ORDER — ENALAPRIL MALEATE 10 MG PO TABS: 10.0000 mg | ORAL_TABLET | Freq: Every day | ORAL | Status: DC

## 2011-08-18 NOTE — Progress Notes (Signed)
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  Patient was admitted in 5/11 after having about 2 weeks of on and off severe chest pain.  She was found to have a 95% proximal LAD stenosis on cath with akinetic myocardium in the LAD distribution.  She had a PROMUS DES to the LAD and was treated for systolic CHF.  Repeat echo in 9/11 showed that EF had normalized.   [name] is doing quite well.  She rides her stationary bike 2-3 times a week for 30-45 minutes at a time.  No chest pain.  No exertional dyspnea.  No lightheadedness.  She is not taking Lasix.  BP is very high today, to my recheck it is 180/90.  She brings in readings from the last 6 months.  Over the last month or two, BP has begun to rise.   She is taking all her medications.   ECG: NSR, poor anterior R wave progression, rate 59  Labs (6/11): K 4.7, creatinine 0.9, LDL 128, HDL 38, BNP 149 Labs (7/11): K 4.7, creatinine 1.0 Labs (8/11): K 4.3, creatinine 1.0, LDL 67, HDL 38, LFTs normal Labs (78/29): K 4.6, creatinine 1.0 Labs (5/12): LDL 59, HDL 53  Allergies (verified):  No Known Drug Allergies  Past Medical History: 1. CAD: Patient had an out of hospital (late presentation) anterior MI in 5/11.  LHC showed 95% proximal LAD stenosis.  Patient had a PROMUS DES to the LAD.  2. Ischemic cardiomyopathy: Echo (5/11) showed EF 30% with anterior, anteroseptal, inferoseptal, and apex akinesis.  There was restrictive diastolic function, moderate mitral regurgitation, and PA systolic pressure 59 mmHg.  Repeat echo (9/11) with EF 60%, mild LVH, no regional wall motion abnormalities, PA systolic pressure 32 mmHg, mild MR.   3. Remote tobacco abuse.  4. Hypertension.  5. Osteoarthritis  6. Mitral regurgitation: moderate mitral regurgitation by echo 5/11 but only mild on repeat echo in 9/11.  7. Hyperlipidemia 8. s/p TAH-BSO  Family History: Noncontributory  Social History: The patient  lives in [place] at [place].  She is widowed and lives alone though her children are near.   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  She does not  drink any alcohol.   ROS: All systems reviewed and negative except as per HPI.   Current Outpatient Prescriptions  Medication Sig Dispense Refill  . aspirin EC 81 MG tablet Take 1 tablet (81 mg total) by mouth daily.      . carvedilol (COREG) 12.5 MG tablet Take 1 tablet (12.5 mg total) by mouth daily.  60 tablet  6  . clopidogrel (PLAVIX) 75 MG tablet TAKE ONE TABLET BY MOUTH DAILY WITH A MEAL  90 tablet  1  . CRESTOR 20 MG tablet TAKE 1 TABLET BY MOUTH AT BEDTIME  90 tablet  1  . nitroGLYCERIN (NITROSTAT) 0.4 MG SL tablet Place 0.4 mg under the tongue every 5 (five) minutes as needed.        Marland Kitchen spironolactone (ALDACTONE) 25 MG tablet TAKE 1 TABLET BY MOUTH EVERY DAY  90 tablet  1  . DISCONTD: enalapril (VASOTEC) 5 MG tablet Take 1 tablet (5 mg total) by mouth 2 (two) times daily.  60 tablet  6  . enalapril (VASOTEC) 10 MG tablet Take 1 tablet (10 mg total) by mouth daily.  60 tablet  6    BP 180/90  Pulse 59  Ht  5\' 6"  (1.676 m)  Wt 130 lb (58.968 kg)  BMI 20.98 kg/m2 General:  Elderly woman in no apparent distress Neck:  Neck supple, no JVD. No masses, thyromegaly or abnormal cervical nodes. Lungs:  Clear bilaterally to auscultation and percussion. Heart:  Non-displaced PMI, chest non-tender; regular rate and rhythm, S1, S2 without murmurs, rubs. +S4. Carotid upstroke normal, no bruit.  Pedals normal pulses. No edema, no varicosities. Abdomen:  Bowel sounds positive; abdomen soft and non-tender without masses, organomegaly, or hernias noted. No hepatosplenomegaly. Extremities:  No clubbing or cyanosis. Neurologic:  Alert and oriented x 3. Psych:  Normal affect.

## 2011-08-18 NOTE — Assessment & Plan Note (Signed)
Followup echo in 9/11 after MI showed that LV systolic function, which had initially been moderately to severe decreased, had improved.  She appears euvolemic on exam.  Continue Coreg, spironolactone, and enalapril.  She will get a BMET today.  Will need BMET every 3 months or so while taking spironolactone.

## 2011-08-18 NOTE — Assessment & Plan Note (Signed)
Check lipids with goal LDL < 70.   

## 2011-08-18 NOTE — Patient Instructions (Addendum)
Increase enalapril to 10mg  twice a day. You can take two 5mg  tablets twice a day.  Take and record your blood pressure daily. I will call you in about 2 weeks to get the readings. Luana Shu  [phone]  Your physician recommends that you have  a FASTING lipid profile /BMET today  414.01  401.9  Your physician recommends that you schedule a follow-up appointment in: 1 month with Dr Shirlee Latch.  Your physician recommends that you return for lab work in: 1 month when you see Dr McLean--BMET 414.01  401.9

## 2011-08-18 NOTE — Assessment & Plan Note (Signed)
BP is high today.  Increase enalapril to 10 mg bid with BP check in 2 wks (she will check every other day).  BMET again in 1 month and followup with me in the office at that time.

## 2011-08-18 NOTE — Assessment & Plan Note (Signed)
Status post out-of-hospital anterior MI with normalized EF and no ischemic symptoms.  Continue regular exercise.  Continue ASA, Plavix, Coreg, ACEI, and statin.

## 2011-08-26 ENCOUNTER — Other Ambulatory Visit: Payer: Self-pay | Admitting: Cardiovascular Disease

## 2011-09-03 ENCOUNTER — Telehealth: Payer: Self-pay | Admitting: *Deleted

## 2011-09-03 NOTE — Telephone Encounter (Signed)
Pt states her son has been checking her BP and she doesn't have the list. Her son is not available right now. She did say in the last week her BP has been better. She gave me two readings from this week--124/62 and 130/82. She also states she has been trying to decrease her caffeine intake. She has an appt to see Dr Shirlee Latch and [facility] 09/19/11. She will ask her son to continue to check her BP and bring the readings to her appt 09/19/11.

## 2011-09-03 NOTE — Telephone Encounter (Signed)
09/03/11 talked with pt.

## 2011-09-03 NOTE — Telephone Encounter (Signed)
That looks fine 

## 2011-09-03 NOTE — Telephone Encounter (Signed)
HYPERTENSION - Joanna Ancona, MD 08/18/2011 2:00 PM Signed  BP is high today. Increase enalapril to 10 mg bid with BP check in 2 wks (she will check every other day). BMET again in 1 month and followup with me in the office at that time.

## 2011-09-19 ENCOUNTER — Ambulatory Visit: Payer: Medicare Other | Admitting: Cardiology

## 2011-09-19 ENCOUNTER — Other Ambulatory Visit: Payer: Medicare Other

## 2011-12-09 ENCOUNTER — Other Ambulatory Visit: Payer: Self-pay | Admitting: Cardiovascular Disease

## 2012-01-09 ENCOUNTER — Other Ambulatory Visit: Payer: Medicare Other

## 2012-01-09 ENCOUNTER — Ambulatory Visit: Payer: Medicare Other | Admitting: Cardiology

## 2012-01-15 ENCOUNTER — Telehealth: Payer: Self-pay | Admitting: Cardiology

## 2012-01-15 NOTE — Telephone Encounter (Signed)
New Problem:    Called needing instructions for the patients enalapril (VASOTEC) 10 MG tablet.  Please call back. Patient is at the pharmacy.

## 2012-01-16 ENCOUNTER — Telehealth: Payer: Self-pay | Admitting: Cardiology

## 2012-01-16 ENCOUNTER — Other Ambulatory Visit: Payer: Self-pay

## 2012-01-16 DIAGNOSIS — I10 Essential (primary) hypertension: Secondary | ICD-10-CM

## 2012-01-16 DIAGNOSIS — I2510 Atherosclerotic heart disease of native coronary artery without angina pectoris: Secondary | ICD-10-CM

## 2012-01-16 MED ORDER — ENALAPRIL MALEATE 10 MG PO TABS: 10.0000 mg | ORAL_TABLET | Freq: Two times a day (BID) | ORAL | Status: DC

## 2012-01-16 MED ORDER — ENALAPRIL MALEATE 10 MG PO TABS: 10.0000 mg | ORAL_TABLET | Freq: Every day | ORAL | Status: DC

## 2012-01-16 NOTE — Telephone Encounter (Signed)
Pt' son calling  Re BP med that was cut in half, now BP up , pls call pt back [phone]

## 2012-01-16 NOTE — Telephone Encounter (Signed)
Patient was taken Enalapril 5 mg twice a day, and her BP is  High. According to 08/1111 pt was to take 10 mg twice a day instead. A  prescription   for Enalapril 10 mg twice a day 90 days supply and 3 refills send to [facility], pt aware.

## 2012-02-09 ENCOUNTER — Other Ambulatory Visit: Payer: Self-pay | Admitting: Cardiology

## 2012-02-16 ENCOUNTER — Encounter: Payer: Self-pay | Admitting: Cardiology

## 2012-02-16 ENCOUNTER — Ambulatory Visit (INDEPENDENT_AMBULATORY_CARE_PROVIDER_SITE_OTHER): Payer: Medicare Other | Admitting: Cardiology

## 2012-02-16 VITALS — BP 172/100 | HR 60 | Ht 66.0 in | Wt 129.8 lb

## 2012-02-16 DIAGNOSIS — I2510 Atherosclerotic heart disease of native coronary artery without angina pectoris: Secondary | ICD-10-CM

## 2012-02-16 DIAGNOSIS — I10 Essential (primary) hypertension: Secondary | ICD-10-CM

## 2012-02-16 DIAGNOSIS — E785 Hyperlipidemia, unspecified: Secondary | ICD-10-CM

## 2012-02-16 DIAGNOSIS — E7800 Pure hypercholesterolemia, unspecified: Secondary | ICD-10-CM

## 2012-02-16 DIAGNOSIS — I2589 Other forms of chronic ischemic heart disease: Secondary | ICD-10-CM

## 2012-02-16 MED ORDER — ENALAPRIL MALEATE 20 MG PO TABS: 20.0000 mg | ORAL_TABLET | Freq: Two times a day (BID) | ORAL | Status: DC

## 2012-02-16 NOTE — Assessment & Plan Note (Signed)
Status post out-of-hospital anterior MI with normalized EF and no ischemic symptoms.  Continue regular exercise.  Continue ASA, Plavix, Coreg, ACEI, and statin.  She has been on Plavix for over 2 years now (DES).  Will plan on stopping it if she is stable at next appointment.

## 2012-02-16 NOTE — Progress Notes (Signed)
Patient ID: Joanna Gay, female   DOB: 06-25-1921, 78 y.o.   MRN: [id number] 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  Patient was admitted in 5/11 after having about 2 weeks of on and off severe chest pain.  She was found to have a 95% proximal LAD stenosis on cath with akinetic myocardium in the LAD distribution.  She had a PROMUS DES to the LAD and was treated for systolic CHF.  Repeat echo in 9/11 showed that EF had normalized.   Joanna Gay is doing quite well.  She rides her stationary bike 2-3 times a week for 30-45 minutes at a time.  No chest pain.  No exertional dyspnea.  No lightheadedness.  She walks with a cane due to ankle instability.  She is not taking Lasix.  She has not been taking spironolactone.  BP has been running high recently: it was 172/100 in the office today.    Labs (6/11): K 4.7, creatinine 0.9, LDL 128, HDL 38, BNP 149 Labs (7/11): K 4.7, creatinine 1.0 Labs (8/11): K 4.3, creatinine 1.0, LDL 67, HDL 38, LFTs normal Labs (56/21): K 4.6, creatinine 1.0 Labs (5/12): LDL 59, HDL 53 Labs (3/13): K 4.6, creatinine 1.0, LDL 76, HDL 63  Allergies (verified):  No Known Drug Allergies  Past Medical History: 1. CAD: Patient had an out of hospital (late presentation) anterior MI in 5/11.  LHC showed 95% proximal LAD stenosis.  Patient had a PROMUS DES to the LAD.  2. Ischemic cardiomyopathy: Echo (5/11) showed EF 30% with anterior, anteroseptal, inferoseptal, and apex akinesis.  There was restrictive diastolic function, moderate mitral regurgitation, and PA systolic pressure 59 mmHg.  Repeat echo (9/11) with EF 60%, mild LVH, no regional wall motion abnormalities, PA systolic pressure 32 mmHg, mild MR.   3. Remote tobacco abuse.  4. Hypertension.  5. Osteoarthritis  6. Mitral regurgitation: moderate mitral regurgitation by echo 5/11 but only mild on repeat echo in 9/11.  7. Hyperlipidemia 8. s/p TAH-BSO  Family  History: Noncontributory  Social History: The patient lives in [place] at [place].  She is widowed and lives alone though her children are near.   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  She does not  drink any alcohol.   ROS: All systems reviewed and negative except as per HPI.   Current Outpatient Prescriptions  Medication Sig Dispense Refill  . aspirin EC 81 MG tablet Take 1 tablet (81 mg total) by mouth daily.      . carvedilol (COREG) 12.5 MG tablet Take 1 tablet (12.5 mg total) by mouth daily.  60 tablet  6  . clopidogrel (PLAVIX) 75 MG tablet TAKE ONE TABLET BY MOUTH DAILY WITH A MEAL  90 tablet  1  . CRESTOR 20 MG tablet TAKE 1 TABLET BY MOUTH AT BEDTIME  90 tablet  0  . enalapril (VASOTEC) 20 MG tablet Take 1 tablet (20 mg total) by mouth 2 (two) times daily.  180 tablet  3  . nitroGLYCERIN (NITROSTAT) 0.4 MG SL tablet Place 0.4 mg under the tongue every 5 (five) minutes as needed.        Marland Kitchen DISCONTD: enalapril (VASOTEC) 10 MG tablet Take 1 tablet (10 mg total) by mouth 2 (two) times daily.  180 tablet  3    BP 180/90  Pulse 59  Ht 5\' 6"  (1.676 m)  Wt 130 lb (58.968 kg)  BMI 20.98 kg/m2 General:  Elderly woman in no apparent distress Neck:  Neck supple, no JVD. No masses, thyromegaly or abnormal cervical nodes. Lungs:  Clear bilaterally to auscultation and percussion. Heart:  Non-displaced PMI, chest non-tender; regular rate and rhythm, S1, S2 without murmurs, rubs. +S4. Carotid upstroke normal, no bruit.  Pedals normal pulses. No edema, prominent lower leg venous varicosities. Abdomen:  Bowel sounds positive; abdomen soft and non-tender without masses, organomegaly, or hernias noted. No hepatosplenomegaly. Extremities:  No clubbing or cyanosis. Neurologic:  Alert and oriented x 3. Psych:  Normal affect.

## 2012-02-16 NOTE — Assessment & Plan Note (Signed)
Followup echo in 9/11 after MI showed that LV systolic function, which had initially been moderately to severe decreased, had improved.  She appears euvolemic on exam.  Continue Coreg and enalapril. She has stopped spironolactone, and I will leave her off it for now.

## 2012-02-16 NOTE — Patient Instructions (Addendum)
Please increase your Enalapril to 20 mg twice a day.  Continue all other medications as listed.  Please have blood work in 10 days.  (fasting BMP, CBC and Lipids)  Please call Thur[facility] Hole with blood pressures in 2 weeks. [phone])  Follow up with Dr Shirlee Latch in 4 months

## 2012-02-16 NOTE — Assessment & Plan Note (Signed)
BP is running high.  I will increase enalapril to 20 mg bid with BMET and BP check in 2 wks.

## 2012-02-16 NOTE — Assessment & Plan Note (Signed)
Check lipids with labs in 2 wks, goal LDL < 70.

## 2012-03-01 ENCOUNTER — Other Ambulatory Visit (INDEPENDENT_AMBULATORY_CARE_PROVIDER_SITE_OTHER): Payer: Medicare Other

## 2012-03-01 DIAGNOSIS — E7800 Pure hypercholesterolemia, unspecified: Secondary | ICD-10-CM

## 2012-03-01 DIAGNOSIS — I2510 Atherosclerotic heart disease of native coronary artery without angina pectoris: Secondary | ICD-10-CM

## 2012-03-01 DIAGNOSIS — I10 Essential (primary) hypertension: Secondary | ICD-10-CM

## 2012-03-01 LAB — CBC
HCT: 43 % (ref 36.0–46.0)
Hemoglobin: 14.4 g/dL (ref 12.0–15.0)
MCHC: 33.6 g/dL (ref 30.0–36.0)
MCV: 90.2 fl (ref 78.0–100.0)
Platelets: 189 10*3/uL (ref 150.0–400.0)
RBC: 4.76 Mil/uL (ref 3.87–5.11)

## 2012-03-02 LAB — BASIC METABOLIC PANEL
CO2: 24 mEq/L (ref 19–32)
Calcium: 10 mg/dL (ref 8.4–10.5)
Chloride: 102 mEq/L (ref 96–112)
Glucose, Bld: 99 mg/dL (ref 70–99)
Potassium: 4.3 mEq/L (ref 3.5–5.1)
Sodium: 143 mEq/L (ref 135–145)

## 2012-03-02 LAB — LIPID PANEL
LDL Cholesterol: 77 mg/dL (ref 0–99)
Total CHOL/HDL Ratio: 3

## 2012-04-13 ENCOUNTER — Other Ambulatory Visit: Payer: Self-pay | Admitting: *Deleted

## 2012-04-13 MED ORDER — CARVEDILOL 12.5 MG PO TABS: 12.5000 mg | ORAL_TABLET | Freq: Every day | ORAL | Status: DC

## 2012-07-19 ENCOUNTER — Other Ambulatory Visit: Payer: Self-pay | Admitting: Cardiology

## 2012-08-15 ENCOUNTER — Other Ambulatory Visit: Payer: Self-pay | Admitting: Cardiology

## 2012-09-13 ENCOUNTER — Other Ambulatory Visit: Payer: Self-pay | Admitting: Cardiology

## 2012-12-04 ENCOUNTER — Other Ambulatory Visit: Payer: Self-pay | Admitting: Cardiology

## 2013-01-07 ENCOUNTER — Telehealth: Payer: Self-pay | Admitting: Cardiology

## 2013-01-07 NOTE — Telephone Encounter (Signed)
New Prob     Labs schedule for 9/26. Orders needed in EPIC.

## 2013-01-07 NOTE — Telephone Encounter (Signed)
Pt had BMP, CBC and Lipid checked in September 2013. The pt is scheduled to see Dr Shirlee Latch on 03/04/13 and the schedulers put her in for lab appointment the same day. The pt's labs can be ordered from her appointment with Dr Shirlee Latch.

## 2013-03-04 ENCOUNTER — Encounter: Payer: Self-pay | Admitting: Cardiology

## 2013-03-04 ENCOUNTER — Ambulatory Visit (INDEPENDENT_AMBULATORY_CARE_PROVIDER_SITE_OTHER): Payer: Medicare Other | Admitting: Cardiology

## 2013-03-04 ENCOUNTER — Other Ambulatory Visit: Payer: Medicare Other

## 2013-03-04 VITALS — BP 180/98 | HR 62 | Wt 126.0 lb

## 2013-03-04 DIAGNOSIS — I2589 Other forms of chronic ischemic heart disease: Secondary | ICD-10-CM

## 2013-03-04 DIAGNOSIS — E785 Hyperlipidemia, unspecified: Secondary | ICD-10-CM

## 2013-03-04 DIAGNOSIS — I2510 Atherosclerotic heart disease of native coronary artery without angina pectoris: Secondary | ICD-10-CM

## 2013-03-04 LAB — CBC WITH DIFFERENTIAL/PLATELET
Basophils Absolute: 0 10*3/uL (ref 0.0–0.1)
Basophils Relative: 0.4 % (ref 0.0–3.0)
Eosinophils Absolute: 0.1 10*3/uL (ref 0.0–0.7)
Lymphocytes Relative: 17.4 % (ref 12.0–46.0)
MCHC: 33.6 g/dL (ref 30.0–36.0)
MCV: 89.3 fl (ref 78.0–100.0)
Monocytes Absolute: 0.6 10*3/uL (ref 0.1–1.0)
Neutro Abs: 5.5 10*3/uL (ref 1.4–7.7)
Neutrophils Relative %: 72.5 % (ref 43.0–77.0)
Platelets: 195 10*3/uL (ref 150.0–400.0)
RDW: 14.5 % (ref 11.5–14.6)
WBC: 7.6 10*3/uL (ref 4.5–10.5)

## 2013-03-04 LAB — BASIC METABOLIC PANEL
BUN: 15 mg/dL (ref 6–23)
CO2: 27 mEq/L (ref 19–32)
Calcium: 9.4 mg/dL (ref 8.4–10.5)
Chloride: 105 mEq/L (ref 96–112)
Creatinine, Ser: 1 mg/dL (ref 0.4–1.2)
Glucose, Bld: 96 mg/dL (ref 70–99)

## 2013-03-04 LAB — LIPID PANEL
Cholesterol: 145 mg/dL (ref 0–200)
HDL: 52.8 mg/dL (ref >39.00)
Triglycerides: 144 mg/dL (ref 0.0–149.0)

## 2013-03-04 NOTE — Patient Instructions (Addendum)
Your physician wants you to follow-up in: ONE YEAR WITH DR [facility] You will receive a reminder letter in the mail two months in advance. If you don't receive a letter, please call our office to schedule the follow-up appointment.   Your physician recommends that you HAVE LAB WORK TODAY  STOP PLAVIX

## 2013-03-06 NOTE — Progress Notes (Signed)
Patient ID: Joanna Gay, female   DOB: 1922-02-21, 79 y.o.   MRN: [id number] PCP: Dr. Cleta Alberts  79 yo with history of late-presentation anterior MI presents for followup.  Patient was admitted in 5/11 after having about 2 weeks of on and off severe chest pain.  She was found to have a 95% proximal LAD stenosis on cath with akinetic myocardium in the LAD distribution.  She had a PROMUS DES to the LAD and was treated for systolic CHF.  Repeat echo in 9/11 showed that EF had normalized.   Mrs Diffee is doing quite well.  She goes to the gym 2-3 times per week.  No chest pain.  No exertional dyspnea.  No lightheadedness.  BP is quite high today, but she has not taken any of her medications.  At home, SBP 130s-140s.   ECG: NSR with PVC  Labs (6/11): K 4.7, creatinine 0.9, LDL 128, HDL 38, BNP 149 Labs (7/11): K 4.7, creatinine 1.0 Labs (8/11): K 4.3, creatinine 1.0, LDL 67, HDL 38, LFTs normal Labs (40/98): K 4.6, creatinine 1.0 Labs (5/12): LDL 59, HDL 53 Labs (3/13): K 4.6, creatinine 1.0, LDL 76, HDL 63 Labs (9/13): K 4.3, creatinine 0.8, LDL 77, HDL 53  Allergies (verified):  No Known Drug Allergies  Past Medical History: 1. CAD: Patient had an out of hospital (late presentation) anterior MI in 5/11.  LHC showed 95% proximal LAD stenosis.  Patient had a PROMUS DES to the LAD.  2. Ischemic cardiomyopathy: Echo (5/11) showed EF 30% with anterior, anteroseptal, inferoseptal, and apex akinesis.  There was restrictive diastolic function, moderate mitral regurgitation, and PA systolic pressure 59 mmHg.  Repeat echo (9/11) with EF 60%, mild LVH, no regional wall motion abnormalities, PA systolic pressure 32 mmHg, mild MR.   3. Remote tobacco abuse.  4. Hypertension.  5. Osteoarthritis  6. Mitral regurgitation: moderate mitral regurgitation by echo 5/11 but only mild on repeat echo in 9/11.  7. Hyperlipidemia 8. s/p TAH-BSO  Family History: Noncontributory  Social History: The  patient lives in [place] at [place].  She is widowed and lives alone though her children are near.   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  She does not  drink any alcohol.   ROS: All systems reviewed and negative except as per HPI.   Current Outpatient Prescriptions  Medication Sig Dispense Refill  . aspirin EC 81 MG tablet Take 1 tablet (81 mg total) by mouth daily.      . carvedilol (COREG) 12.5 MG tablet Take 1 tablet (12.5 mg total) by mouth daily.  60 tablet  6  . CRESTOR 20 MG tablet TAKE 1 TABLET BY MOUTH EVERY NIGHT AT BEDTIME  90 tablet  0  . enalapril (VASOTEC) 20 MG tablet Take 1 tablet (20 mg total) by mouth 2 (two) times daily.  180 tablet  3  . nitroGLYCERIN (NITROSTAT) 0.4 MG SL tablet Place 0.4 mg under the tongue every 5 (five) minutes as needed.         No current facility-administered medications for this visit.    BP 180/90  Pulse 59  Ht 5\' 6"  (1.676 m)  Wt 130 lb (58.968 kg)  BMI 20.98 kg/m2 General:  Elderly woman in no apparent distress Neck:  Neck supple, no JVD. No masses, thyromegaly or abnormal cervical nodes. Lungs:  Clear bilaterally to auscultation and percussion. Heart:  Non-displaced PMI, chest non-tender; regular rate and rhythm, S1,  S2 without murmurs, rubs. +S4. Carotid upstroke normal, no bruit.  Pedals normal pulses. 1+ ankle edema, prominent lower leg venous varicosities. Abdomen:  Bowel sounds positive; abdomen soft and non-tender without masses, organomegaly, or hernias noted. No hepatosplenomegaly. Extremities:  No clubbing or cyanosis. Neurologic:  Alert and oriented x 3. Psych:  Normal affect.  Assessment/Plan: 1. CAD: Out of hospital anterior MI about 3 years ago.  She is doing well with no ischemic symptoms.  - At this point, may stop Plavix.  - Continue ASA, statin, Coreg, enalapril.  2. Hyperlipidemia: Check lipids today.   3. Ischemic cardiomyopathy: EF improved to normal on most recent echo.  She  is doing well without significant dyspnea.  Continue current doses of Coreg and enalapril.  I will check BMET today.  4. HTN: BP high but no meds taken today.  SBP 130s-140s after meds at home.  This is acceptable.   Marca Ancona 03/06/2013

## 2013-03-23 ENCOUNTER — Other Ambulatory Visit: Payer: Self-pay | Admitting: Cardiology

## 2013-03-30 ENCOUNTER — Other Ambulatory Visit: Payer: Self-pay | Admitting: Cardiology

## 2013-07-13 ENCOUNTER — Other Ambulatory Visit: Payer: Self-pay | Admitting: Cardiology

## 2013-12-16 NOTE — Telephone Encounter (Signed)
Close encounter 

## 2014-01-31 ENCOUNTER — Other Ambulatory Visit: Payer: Self-pay | Admitting: Cardiology

## 2014-05-09 ENCOUNTER — Other Ambulatory Visit: Payer: Self-pay | Admitting: Cardiology

## 2014-05-29 ENCOUNTER — Ambulatory Visit: Payer: Medicare Other | Admitting: Sports Medicine

## 2014-06-13 ENCOUNTER — Ambulatory Visit: Payer: Medicare Other | Admitting: Sports Medicine

## 2014-06-23 ENCOUNTER — Other Ambulatory Visit: Payer: Self-pay | Admitting: Cardiology

## 2014-07-03 ENCOUNTER — Ambulatory Visit: Payer: Medicare Other | Admitting: Sports Medicine

## 2014-07-07 ENCOUNTER — Ambulatory Visit (INDEPENDENT_AMBULATORY_CARE_PROVIDER_SITE_OTHER): Payer: Medicare Other | Admitting: Physician Assistant

## 2014-07-07 ENCOUNTER — Encounter: Payer: Self-pay | Admitting: Physician Assistant

## 2014-07-07 VITALS — BP 180/96 | HR 74 | Ht 66.0 in | Wt 125.0 lb

## 2014-07-07 DIAGNOSIS — I255 Ischemic cardiomyopathy: Secondary | ICD-10-CM

## 2014-07-07 DIAGNOSIS — I2510 Atherosclerotic heart disease of native coronary artery without angina pectoris: Secondary | ICD-10-CM

## 2014-07-07 DIAGNOSIS — E785 Hyperlipidemia, unspecified: Secondary | ICD-10-CM

## 2014-07-07 DIAGNOSIS — I10 Essential (primary) hypertension: Secondary | ICD-10-CM

## 2014-07-07 LAB — BASIC METABOLIC PANEL
BUN: 18 mg/dL (ref 6–23)
CALCIUM: 9.9 mg/dL (ref 8.4–10.5)
CHLORIDE: 105 meq/L (ref 96–112)
CO2: 26 mEq/L (ref 19–32)
Creatinine, Ser: 0.94 mg/dL (ref 0.40–1.20)
GFR: 59.41 mL/min — ABNORMAL LOW (ref >60.00)
GLUCOSE: 88 mg/dL (ref 70–99)
Potassium: 4.6 mEq/L (ref 3.5–5.1)
Sodium: 139 mEq/L (ref 135–145)

## 2014-07-07 LAB — HEPATIC FUNCTION PANEL
ALBUMIN: 4.3 g/dL (ref 3.5–5.2)
ALT: 10 U/L (ref 0–35)
AST: 18 U/L (ref 0–37)
Alkaline Phosphatase: 88 U/L (ref 39–117)
BILIRUBIN TOTAL: 0.7 mg/dL (ref 0.2–1.2)
Bilirubin, Direct: 0.2 mg/dL (ref 0.0–0.3)
TOTAL PROTEIN: 7.4 g/dL (ref 6.0–8.3)

## 2014-07-07 MED ORDER — CARVEDILOL 6.25 MG PO TABS: 6.2500 mg | ORAL_TABLET | Freq: Two times a day (BID) | ORAL | Status: DC

## 2014-07-07 NOTE — Progress Notes (Signed)
Cardiology Office Note   Date:  07/07/2014   ID:  Joanna Gay, DOB 1921-07-15, MRN [id number]  PCP:  No primary care provider on file.  Patient Care Team: Laurey Moralealton S McLean, MD as Consulting Physician (Cardiology)    Chief Complaint  Patient presents with  . Coronary Artery Disease  . Hypertension  . Hyperlipidemia     History of Present Illness: Joanna Gay is a 81 y.o. female who presents for FU on the above.  She has a history of CAD with late-presentation anterior MI. Patient was admitted in 5/11 after having about 2 weeks of on and off severe chest pain. She was found to have a 95% proximal LAD stenosis on cath with akinetic myocardium in the LAD distribution. She had a PROMUS DES to the LAD and was treated for systolic CHF. Repeat echo in 9/11 showed that EF had normalized.   Last seen by Dr. Marca Anconaalton McLean 02/2013.  She is here with her son.   She denies chest pain, shortness of breath, syncope, orthopnea, PND.  She has chronic ankle edema without change.  She goes to the gym on a regular basis. She denies any difficulty.   Studies/Reports Reviewed Today:  - LHC (10/2009):  p LAD 95, EF 35% >> PCI: 3 x 18 mm Promus DES to the proximal LAD  - Echocardiogram (5/11):  EF 30%, akinesis of the anterior, anteroseptal, inferoseptal and apical walls, moderate MR, mild LAE, PASP 59 mmHg, trivial effusion  - Echo (9/11) with EF 60%, mild LVH, no regional wall motion abnormalities, PA systolic pressure 32 mmHg, mild MR.    Past Medical History  Diagnosis Date  . Coronary artery disease     late presentation anterior MI in 5/11. LHC showed 95% proximal LAD stenosis.  Patient had a PROMUS DES to the LAD.  . Ischemic cardiomyopathy     Echo (5/11) showed EF 30% with anterior, anteroseptal, inferoseptal, and apex akinesis. There was restrictive diastolic function, moderate mitral regurgitation, and PA systolic pressure 59 mmHg. Repeat echo (9/11) with EF 60%,  mild LVH, no regional wall motion abnormalities, PA systolic pressure 32 mmHg, mild MR.   Marland Kitchen. Hypertension   . Osteoarthritis   . Moderate mitral regurgitation by prior echocardiogram     moderate mitral regurgitation by echo 5/11 but only mild on repeat echo in 9/11.   Marland Kitchen. Hyperlipidemia   . S/P TAH-BSO   . History of tobacco use     Past Surgical History  Procedure Laterality Date  . Carotid stent insertion    . Total abdominal hysterectomy w/ bilateral salpingoophorectomy       Current Outpatient Prescriptions  Medication Sig Dispense Refill  . aspirin EC 81 MG tablet Take 1 tablet (81 mg total) by mouth daily.    . carvedilol (COREG) 12.5 MG tablet Take 1 tablet (12.5 mg total) by mouth daily. 30 tablet 5  . CRESTOR 20 MG tablet TAKE 1 TABLET BY MOUTH EVERY NIGHT AT BEDTIME 90 tablet 2  . enalapril (VASOTEC) 20 MG tablet TAKE 1 TABLET BY MOUTH TWICE DAILY 60 tablet 0  . nitroGLYCERIN (NITROSTAT) 0.4 MG SL tablet Place 0.4 mg under the tongue every 5 (five) minutes as needed.       No current facility-administered medications for this visit.    Allergies:   Review of patient's allergies indicates no known allergies.    Social History:  The patient  reports that she quit smoking about 23 years ago. Her  smoking use included Cigarettes. She does not have any smokeless tobacco history on file. She reports that she does not drink alcohol or use illicit drugs.   Family History:  The patient's family history includes Heart attack in her brother. There is no history of Stroke.    ROS:  Please see the history of present illness.   Otherwise, review of systems are positive for hard of hearing.   All other systems are reviewed and negative.    PHYSICAL EXAM: VS:  BP 180/96 mmHg  Pulse 74  Ht  (1.676 m)  Wt 125 lb (56.7 kg)  BMI 20.19 kg/m2    Wt Readings from Last 3 Encounters:  07/07/14 125 lb (56.7 kg)  03/04/13 126 lb (57.153 kg)  02/16/12 129 lb 12.8 oz (58.877 kg)       GEN: Well nourished, well developed, in no acute distress HEENT: normal Neck: no JVD, no masses Cardiac:  Normal S1/S2, RRR; no murmur, no rubs or gallops, 1+ edema  Respiratory:  clear to auscultation bilaterally, no wheezing, rhonchi or rales. GI: soft, nontender, nondistended, + BS MS: no deformity or atrophy Skin: warm and dry  Neuro:  CNs II-XII intact, Strength and sensation are intact Psych: Normal affect   EKG:  EKG is ordered today.  It demonstrates:   NSR, HR 74, PVC, no change from prior tracing   Recent Labs: No results found for requested labs within last 365 days.    Lipid Panel    Component Value Date/Time   CHOL 145 03/04/2013 0911   TRIG 144.0 03/04/2013 0911   HDL 52.80 03/04/2013 0911   CHOLHDL 3 03/04/2013 0911   VLDL 28.8 03/04/2013 0911   LDLCALC 63 03/04/2013 0911      ASSESSMENT AND PLAN:  1. CAD: Out of hospital anterior MI in 2011 tx with DES to LAD.  She is doing well with no ischemic symptoms.      -  Continue ASA, statin, Coreg, enalapril.   2. Hyperlipidemia:  She is not fasting. She declines coming back to have her lipids checked. Check LFTs today. 3. Ischemic cardiomyopathy: EF improved to normal on most recent echo.  She is doing well without significant dyspnea.  Continue Coreg, enalapril.  I will check BMET today.   4. HTN: BP high but she has been out of Coreg for about a month.  I am not certain why she is listed as taking Coreg 12.5 mg once a day.  Refill Coreg at 6.25 mg twice a day.  Check BP at home.  Bring machine in to FU with me in 1 month.  Current medicines are reviewed at length with the patient today.  The patient does not have concerns regarding medicines.  The following changes have been made:  As above.    Labs/ tests ordered today include:  Orders Placed This Encounter  Procedures  . Basic Metabolic Panel (BMET)  . Hepatic function panel  . EKG 12-Lead     Disposition:   FU with me in 1  month   Signed, Brynda Rim, MHS 07/07/2014 11:00 AM    [facility] [address] [place], [place], [place]  [address] Phone: [phone]; Fax: [phone]

## 2014-07-07 NOTE — Patient Instructions (Addendum)
REFILL COREG 6.25 MG TABLET ; TAKE 1 TABLET TWO TIME DAILY; NEW RX SENT IN TODAY  LAB WORK TODAY; BMET, LFT  [facility] WEAV[facility] 08/09/14 @ 11:10  CHECK BP 2-3 TIMES A WEEK AND BRING READINGS TO FOLLOW UP APPT

## 2014-07-10 ENCOUNTER — Telehealth: Payer: Self-pay | Admitting: *Deleted

## 2014-07-10 NOTE — Telephone Encounter (Signed)
Pt notified about lab results with verbal understanding 

## 2014-08-08 ENCOUNTER — Other Ambulatory Visit: Payer: Self-pay | Admitting: Cardiology

## 2014-08-09 ENCOUNTER — Ambulatory Visit: Payer: Medicare Other | Admitting: Physician Assistant

## 2014-09-14 ENCOUNTER — Other Ambulatory Visit: Payer: Self-pay | Admitting: Cardiology

## 2014-09-14 NOTE — Telephone Encounter (Signed)
Approved      Disp Refills Start End    CRESTOR 20 MG tablet 30 tablet 0 08/09/2014     Sig:  TAKE 1 TABLET BY MOUTH EVERY NIGHT AT BEDTIME    Class:  Normal    DAW:  No    Comment:  **patient needs an appointment**    Authorizing Provider:  Laurey Moralealton S McLean, MD    Ordering User:  Valrie HartMindy L Isley, CMA

## 2014-09-21 ENCOUNTER — Ambulatory Visit (INDEPENDENT_AMBULATORY_CARE_PROVIDER_SITE_OTHER): Payer: Medicare Other | Admitting: Family Medicine

## 2014-09-21 VITALS — BP 160/84 | HR 65 | Temp 97.7°F | Resp 16 | Ht 66.0 in | Wt 129.0 lb

## 2014-09-21 DIAGNOSIS — L84 Corns and callosities: Secondary | ICD-10-CM

## 2014-09-21 DIAGNOSIS — M19079 Primary osteoarthritis, unspecified ankle and foot: Secondary | ICD-10-CM | POA: Diagnosis not present

## 2014-09-21 DIAGNOSIS — M79676 Pain in unspecified toe(s): Secondary | ICD-10-CM | POA: Diagnosis not present

## 2014-09-21 DIAGNOSIS — B351 Tinea unguium: Secondary | ICD-10-CM | POA: Insufficient documentation

## 2014-09-21 NOTE — Patient Instructions (Signed)
Recommend that you get your toenails trimmed every 4-6 months or sooner if needed  It might be a good idea for you to go to a podiatrist on a regular basis. [facility] is a good one in this area, and I think there others.  The medication I offered you which she declined was terbinafine which she needs to take for 12 weeks for fungal toenails. If you decide that you wish to go that route you can return. Liver function tests need to be monitored while you are on that medicine.  If you have any reason to thank you toe is getting infected get it checked immediately.  If you come in for toenail trimming, try to soak the toes for an hour or so before coming in.  Return as needed

## 2014-09-21 NOTE — Progress Notes (Signed)
Subjective: Patient is here complaining of foot pain. She says that her toes get to bumping into each other and to hurt. The only regular medication is medication for her heart. She is on blood thinners and antilipid medications along with the heart medication. She sees a cardiologist, no other regular doctors. She has not seen a podiatrist. She usually trims her own toenails. She is frustrated with having had to wait today. It hurts when she goes to the gym.  Objective: Elderly lady 81 years old, alert and oriented. Arthritic appearing toes on the left foot with hammertoe formations. She has onychomycotic toenails with the large toe nail biting into the second toe, and the second third and fourth toes all bite into the nails to the right of them. She has a corn on the tip of the second toe. The large toenail is almost almond shaped, thickened curved.  Procedure note The corn from the second toe was pared off  The toenails were all debrided using several instruments to cut them down until they no longer bite into each other. A tiny bit of bleeding occurred when debriding the large toe. The toes have no infections.  Assessment: Badly ingrown toenails Arthritis of toes Corn second toe Foot pain and toe pain Extreme geriatric age group  Plan: Talk to her about how to take care of her feet. If any concern infections ever she is to come in. Recommend she see a podiatrist on a regular basis. Suggested that consideration of Lamisil. I checked and it would be okay to take it with the Crestor, despite both having potential liver toxicities, but she declined treatment with it.

## 2014-10-04 ENCOUNTER — Other Ambulatory Visit: Payer: Self-pay | Admitting: Cardiology

## 2014-11-29 ENCOUNTER — Other Ambulatory Visit: Payer: Self-pay | Admitting: Cardiology

## 2015-06-28 ENCOUNTER — Ambulatory Visit: Payer: Medicare Other | Admitting: Podiatry

## 2015-07-11 ENCOUNTER — Ambulatory Visit: Payer: Medicare Other | Admitting: Podiatry

## 2015-08-04 ENCOUNTER — Ambulatory Visit (INDEPENDENT_AMBULATORY_CARE_PROVIDER_SITE_OTHER): Payer: Medicare Other | Admitting: Internal Medicine

## 2015-08-04 VITALS — BP 162/100 | HR 95 | Temp 98.3°F | Resp 20 | Ht 66.0 in | Wt 123.8 lb

## 2015-08-04 DIAGNOSIS — S80812A Abrasion, left lower leg, initial encounter: Secondary | ICD-10-CM | POA: Diagnosis not present

## 2015-08-04 DIAGNOSIS — R609 Edema, unspecified: Secondary | ICD-10-CM | POA: Diagnosis not present

## 2015-08-04 DIAGNOSIS — H00036 Abscess of eyelid left eye, unspecified eyelid: Secondary | ICD-10-CM

## 2015-08-04 MED ORDER — MUPIROCIN 2 % EX OINT: TOPICAL_OINTMENT | CUTANEOUS | Status: DC

## 2015-08-04 MED ORDER — CEPHALEXIN 500 MG PO CAPS
500.0000 mg | ORAL_CAPSULE | Freq: Three times a day (TID) | ORAL | Status: DC
Start: 2015-08-04 — End: 2015-10-11

## 2015-08-04 NOTE — Progress Notes (Signed)
Subjective:  By signing my name below, I, Joanna Gay, attest that this documentation has been prepared under the direction and in the presence of Joanna Sia, MD.  Electronically Signed: Andrew Gay, ED Scribe. 08/04/2015. 2:11 PM.   Patient ID: Joanna Gay, female    DOB: [birth year], 82 y.o.   MRN: [id number]  HPI Chief Complaint  Patient presents with  . Leg Swelling    bleeding  . Conjunctivitis    left eye    HPI Comments: Joanna Gay is a 82 y.o. female who presents to the [facility] complaining of leg swelling. Per patients son, pt cut her leg while scratching it yesterday. A bandage was applied to wound but states there has been clear fluid draining from wound. She has occasional pain her feet but denies numbness. She has a an appointment with podiatrist in 2 weeks. She has hx of HDL and HNT. Pt ambulates with a cane or walker. She had a stent placed in 2010. No hx of DM.   Pt also has swelling and redness to left eye lid.   She is mainly followed by cardiologist, Dr. Shirlee Latch. Avoids doctors if at all possible  Past Medical History  Diagnosis Date  . Coronary artery disease     late presentation anterior MI in 5/11. LHC showed 95% proximal LAD stenosis.  Patient had a PROMUS DES to the LAD.  . Ischemic cardiomyopathy     Echo (5/11) showed EF 30% with anterior, anteroseptal, inferoseptal, and apex akinesis. There was restrictive diastolic function, moderate mitral regurgitation, and PA systolic pressure 59 mmHg. Repeat echo (9/11) with EF 60%, mild LVH, no regional wall motion abnormalities, PA systolic pressure 32 mmHg, mild MR.   Marland Kitchen Hypertension   . Osteoarthritis   . Moderate mitral regurgitation by prior echocardiogram     moderate mitral regurgitation by echo 5/11 but only mild on repeat echo in 9/11.   Marland Kitchen Hyperlipidemia   . S/P TAH-BSO   . History of tobacco use   . Myocardial infarction [facility])    Past Surgical History    Procedure Laterality Date  . Carotid stent insertion    . Total abdominal hysterectomy w/ bilateral salpingoophorectomy     Prior to Admission medications   Medication Sig Start Date End Date Taking? Authorizing Provider  aspirin EC 81 MG tablet Take 1 tablet (81 mg total) by mouth daily. 10/18/10  Yes Laurey Morale, MD  carvedilol (COREG) 6.25 MG tablet Take 1 tablet (6.25 mg total) by mouth 2 (two) times daily with a meal. 07/07/14  Yes Beatrice Lecher, PA-C  enalapril (VASOTEC) 20 MG tablet TAKE 1 TABLET BY MOUTH TWICE DAILY 11/30/14  Yes Laurey Morale, MD  rosuvastatin (CRESTOR) 20 MG tablet TAKE 1 TABLET BY MOUTH AT BEDTIME 11/30/14  Yes Laurey Morale, MD   Review of Systems  Eyes: Negative for discharge and visual disturbance.  Cardiovascular: Positive for leg swelling.  Skin: Positive for color change and wound.  Neurological: Negative for weakness and numbness.       Objective:   Physical Exam  Constitutional: She is oriented to person, place, and time. She appears well-developed and well-nourished. No distress.  HENT:  Head: Normocephalic and atraumatic.  Left upper lid is red, swollen and tender. No ocular involvement.   Eyes: Conjunctivae and EOM are normal.  Neck: Neck supple.  Cardiovascular: Normal rate.   Pulmonary/Chest: Effort normal.  Musculoskeletal: Normal range of motion.  Edema of both LE with very thin skin with several areas of superficial fluid collection. Mid ant tib is 2x2cm skin avulsion weeping clear fluid Feet appear chronically swollen with fungal changes of the nails and drift of toes laterally. Sensation intact. Vascular intact. Gait slow,cane.  Neurological: She is alert and oriented to person, place, and time.  Skin: Skin is warm and dry.  Psychiatric: She has a normal mood and affect. Her behavior is normal.  Nursing note and vitals reviewed. pulses sl decr at dors pedis--no digit cyanosis Filed Vitals:   08/04/15 1336  BP: 162/100   Pulse: 95  Temp: 98.3 F (36.8 C)  TempSrc: Oral  Resp: 20  Height:  (1.676 m)  Weight: 123 lb 12.8 oz (56.155 kg)  SpO2: 95%   Dead skin cut away--no infection of leg Assessment & Plan:  Abrasion of leg, left, initial encounter --keep clean and dress to protect--bactroban Peripheral edema--chronic ven insuff -compression when up  Eyelid cellulitis, left --heat --keflex  To see podiatry in5 days for eval foot deformity Meds ordered this encounter  Medications  . mupirocin ointment (BACTROBAN) 2 %    Sig: Apply to wound twice a day    Dispense:  22 g    Refill:  0  . cephALEXin (KEFLEX) 500 MG capsule    Sig: Take 1 capsule (500 mg total) by mouth 3 (three) times daily.    Dispense:  30 capsule    Refill:  0  I have completed the patient encounter in its entirety as documented by the scribe, with editing by me where necessary. Robert P. Merla Riches, M.D.

## 2015-08-16 ENCOUNTER — Emergency Department (HOSPITAL_COMMUNITY)
Admission: EM | Admit: 2015-08-16 | Discharge: 2015-08-16 | Disposition: A | Discharge status: Home / Self Care | Payer: Medicare Other | Source: Home / Self Care

## 2015-08-17 ENCOUNTER — Ambulatory Visit: Payer: Medicare Other | Admitting: Podiatry

## 2015-08-18 ENCOUNTER — Ambulatory Visit (INDEPENDENT_AMBULATORY_CARE_PROVIDER_SITE_OTHER): Payer: Medicare Other | Admitting: Family Medicine

## 2015-08-18 VITALS — BP 126/79 | HR 66 | Temp 97.7°F | Resp 16 | Wt 123.0 lb

## 2015-08-18 DIAGNOSIS — F05 Delirium due to known physiological condition: Secondary | ICD-10-CM | POA: Diagnosis not present

## 2015-08-18 DIAGNOSIS — R600 Localized edema: Secondary | ICD-10-CM | POA: Diagnosis not present

## 2015-08-18 DIAGNOSIS — R0781 Pleurodynia: Secondary | ICD-10-CM | POA: Diagnosis not present

## 2015-08-18 DIAGNOSIS — R2681 Unsteadiness on feet: Secondary | ICD-10-CM | POA: Diagnosis not present

## 2015-08-18 LAB — POCT CBC
Granulocyte percent: 81.7 %G — AB (ref 37–80)
HCT, POC: 39.7 % (ref 37.7–47.9)
Hemoglobin: 14.4 g/dL (ref 12.2–16.2)
Lymph, poc: 1.6 (ref 0.6–3.4)
MCH, POC: 32.2 pg — AB (ref 27–31.2)
MCHC: 36.2 g/dL — AB (ref 31.8–35.4)
MCV: 89 fL (ref 80–97)
MID (cbc): 0.2 (ref 0–0.9)
MPV: 7.3 fL (ref 0–99.8)
POC Granulocyte: 8.4 — AB (ref 2–6.9)
POC LYMPH PERCENT: 16 %L (ref 10–50)
POC MID %: 2.3 %M (ref 0–12)
Platelet Count, POC: 174 10*3/uL (ref 142–424)
RBC: 4.46 M/uL (ref 4.04–5.48)
RDW, POC: 14.6 %
WBC: 10.3 10*3/uL — AB (ref 4.6–10.2)

## 2015-08-18 LAB — GLUCOSE, POCT (MANUAL RESULT ENTRY): POC Glucose: 136 mg/dl — AB (ref 70–99)

## 2015-08-18 NOTE — Progress Notes (Signed)
Decision 82 year old woman who lives alone. She was found by her son and daughter on Thursday morning after she apparently fell. She was somewhat confused but after some hydration with oral fluids, patient seemed to be returned to her normal mental status. The family tried to bring her to several different urgent cares ut they were all close by the time they arrived.   she has continued to push fluids. She has chronic pedal edema , chronic unsteady gait.   Family brings her in because the feel that she's not really able to take care of herself very well and they have been staying with her for the last several days monitoring her progress. Think she might have UTI and he would like to enlist some home health care.   Objective: BP 126/79 mmHg  Pulse 66  Temp(Src) 97.7 F (36.5 C) (Oral)  Resp 16  Wt 123 lb (55.792 kg)  SpO2 96% This SmartLink has not been configured with any valid records.    Wt Readings from Last 3 Encounters:  08/18/15 123 lb (55.792 kg)  08/04/15 123 lb 12.8 oz (56.155 kg)  09/21/14 129 lb (58.514 kg)   is an elderly lady who is lying slumped in a wheelchair I entered the exam room Patient is easily arousable responds normally to verbal commands. She does have somewhat of slurred speech but she's exhibiting no focal weakness.    patient muscles appear symmetric , EOM is intact , neck is supple  Chest: Clear Heart: Regular no murmur Abdomen: Soft nontender although the lower ribs are mildly tender with palpation  Extremities : Patient has  A 1 cm open wound in her left anterior shin. There is some surrounding erythema. She has 3+ lower tibial edema bilaterally and pedal edema symmetrically.    patient appears to have normal orientation. Her responses are appropriate.  This chart was scribed in my presence and reviewed by me personally.    ICD-9-CM ICD-10-CM   1. Rib pain 786.50 R07.81 POCT CBC     POCT urinalysis dipstick     POCT Microscopic Urinalysis ([facility])    POCT glucose (manual entry)     COMPLETE METABOLIC PANEL WITH GFR     Ambulatory referral to Home Health  2. Pedal edema 782.3 R60.0 POCT CBC     POCT urinalysis dipstick     POCT Microscopic Urinalysis ([facility])     POCT glucose (manual entry)     COMPLETE METABOLIC PANEL WITH GFR     Ambulatory referral to Home Health  3. Acute confusional state 293.0 F05 POCT CBC     POCT urinalysis dipstick     POCT Microscopic Urinalysis ([facility])     POCT glucose (manual entry)     COMPLETE METABOLIC PANEL WITH GFR     Ambulatory referral to Home Health  4. Unstable gait 781.2 R26.81 Ambulatory referral to Home Health     Signed, Elvina SidleKurt Ashlea Dusing, MD

## 2015-08-18 NOTE — Patient Instructions (Signed)
We are running several tests to see if there's anything further we can do to improve strength and cognition. At the same time we are calling home health to see if we can get some assistance with activities of daily living on a daily basis.   Please bring in a urine tomorrow morning and I will be here following up with labs and seeing if there is any infection in theurine

## 2015-08-19 LAB — COMPLETE METABOLIC PANEL WITH GFR
ALT: 31 U/L — ABNORMAL HIGH (ref 6–29)
AST: 62 U/L — ABNORMAL HIGH (ref 10–35)
Albumin: 3.6 g/dL (ref 3.6–5.1)
Alkaline Phosphatase: 91 U/L (ref 33–130)
BUN: 35 mg/dL — ABNORMAL HIGH (ref 7–25)
CO2: 26 mmol/L (ref 20–31)
Calcium: 9.3 mg/dL (ref 8.6–10.4)
Chloride: 105 mmol/L (ref 98–110)
Creat: 1.18 mg/dL — ABNORMAL HIGH (ref 0.60–0.88)
GFR, Est African American: 47 mL/min — ABNORMAL LOW (ref >=60)
GFR, Est Non African American: 40 mL/min — ABNORMAL LOW (ref >=60)
Glucose, Bld: 119 mg/dL — ABNORMAL HIGH (ref 65–99)
Potassium: 4.2 mmol/L (ref 3.5–5.3)
Sodium: 140 mmol/L (ref 135–146)
Total Bilirubin: 0.5 mg/dL (ref 0.2–1.2)
Total Protein: 6.4 g/dL (ref 6.1–8.1)

## 2015-08-20 ENCOUNTER — Other Ambulatory Visit: Payer: Self-pay

## 2015-08-20 DIAGNOSIS — I10 Essential (primary) hypertension: Secondary | ICD-10-CM

## 2015-08-20 MED ORDER — CARVEDILOL 6.25 MG PO TABS: 6.2500 mg | ORAL_TABLET | Freq: Two times a day (BID) | ORAL | Status: DC

## 2015-08-20 NOTE — Telephone Encounter (Signed)
ASSESSMENT AND PLAN:  1. CAD: Out of hospital anterior MI in 2011 tx with DES to LAD. She is doing well with no ischemic symptoms.   - Continue ASA, statin, Coreg, enalapril.  2. Hyperlipidemia: She is not fasting. She declines coming back to have her lipids checked. Check LFTs today. 3. Ischemic cardiomyopathy: EF improved to normal on most recent echo. She is doing well without significant dyspnea. Continue Coreg, enalapril. I will check BMET today.  4. HTN: BP high but she has been out of Coreg for about a month. I am not certain why she is listed as taking Coreg 12.5 mg once a day. Refill Coreg at 6.25 mg twice a day. Check BP at home. Bring machine in to FU with me in 1 month.  Current medicines are reviewed at length with the patient today. The patient does not have concerns regarding medicines. The following changes have been made: As above.    Labs/ tests ordered today include:  Orders Placed This Encounter  Procedures  . Basic Metabolic Panel (BMET)  . Hepatic function panel  . EKG 12-Lead     Disposition: FU with me in 1 month   Signed, Brynda RimScott Weaver, PA-C, MHS 07/07/2014 11:00 AM  [facility] [address], [place], [place] [address] Phone: [phone]; Fax: [phone]

## 2015-08-21 ENCOUNTER — Telehealth: Payer: Self-pay

## 2015-08-21 NOTE — Telephone Encounter (Signed)
Dois DavenportSandra with Hospice called in concerning patient's orders. She wants to know if the patient needs hospice or Pallative Care? Patient's son called them to set things up and she needs clarification on what's needed. Dois [facility]Sandra [facility] [phone].

## 2015-08-24 NOTE — Telephone Encounter (Signed)
Received a call from [place] from [facility].  The patient's family originally refused service for home health, but called [facility] to request its reinstatement.  Britta MccreedyBarbara had already called [facility] to issue the discharge due to patient refusal.  If the patient still wants to receive home health, a new referral is needed.  Per previous phone message, the patient also expressed interest in hospice care.  There appears to be confusion as to what the patient and her family wants.  Please advise.  CB# for [facility]: [phone] CB# for patient: [phone]

## 2015-08-26 NOTE — Telephone Encounter (Signed)
Patient's family requested that I refer patient for home health assistance.  This I did.

## 2015-08-27 ENCOUNTER — Telehealth: Payer: Self-pay

## 2015-08-27 NOTE — Telephone Encounter (Signed)
Britta MccreedyBarbara called again. I spoke to Dr. Katrinka BlazingSmith and she said it is okay to give verbal order.  Gave verbal order to [place] at [facility].

## 2015-08-27 NOTE — Telephone Encounter (Signed)
A verbal order for Home Health is needed for [facility] to resume care, since the previous order was closed.  Please return their call.  CB#: [phone]  Placing as high priority since they've been attempting to get these verbal orders since last Thursday.

## 2015-08-27 NOTE — Telephone Encounter (Signed)
Joanna MccreedyBarbara from [facility] called again. Nurse is asking if we can perform in and out catheter and get a urine specimen. Pt is having a lot of pain with urination.   Dr. Katrinka BlazingSmith gave verbal okay.

## 2015-08-28 ENCOUNTER — Other Ambulatory Visit: Payer: Self-pay | Admitting: Family Medicine

## 2015-08-28 DIAGNOSIS — R2681 Unsteadiness on feet: Secondary | ICD-10-CM

## 2015-08-28 DIAGNOSIS — L97421 Non-pressure chronic ulcer of left heel and midfoot limited to breakdown of skin: Secondary | ICD-10-CM

## 2015-08-29 ENCOUNTER — Telehealth: Payer: Self-pay | Admitting: Radiology

## 2015-08-29 NOTE — Telephone Encounter (Signed)
Phone call today Joanna PaxCarrie Epps RN needs order for wound care patient has pressure ulcer left heel / Dr Milus GlazierLauenstein has discussed wound care with nurse. He states he received paper work on patients skin breakdown but now this pressure ulcer is severe, and nurse is unable to stage. Dr Milus GlazierLauenstein states he did write order previously for her. He has given verbal orders to the home health nurse Joanna Paxarrie Gay. She will start tid dressing changes and wound care as per protocol with nursing.

## 2015-09-03 ENCOUNTER — Telehealth: Payer: Self-pay

## 2015-09-03 NOTE — Telephone Encounter (Signed)
Britta M[facility]Barbara from [facility] called wanting to get a verbal ok to do PT and OT on patient. Verified with Connye Burkittlly if I can do that she stated I could. I do see where Dr. Elbert EwingsL put in a referral for this patient to have this.

## 2015-09-04 ENCOUNTER — Telehealth: Payer: Self-pay | Admitting: *Deleted

## 2015-09-04 MED ORDER — CIPROFLOXACIN HCL 250 MG PO TABS: 250.0000 mg | ORAL_TABLET | Freq: Two times a day (BID) | ORAL | Status: DC

## 2015-09-04 NOTE — Telephone Encounter (Signed)
Joanna Paxarrie Epps RN from [facility] about pts urine culture.  It came back positive for UTI.  Per Dr. Milus GlazierLauenstein send in Cipro 250mg  BID for 7 days.

## 2015-09-07 ENCOUNTER — Telehealth: Payer: Self-pay

## 2015-09-07 NOTE — Telephone Encounter (Signed)
I have already written orders for physical therapy because of unstable gait and balance.  Patient needs to find a primary care provider since I am retiring.

## 2015-09-07 NOTE — Telephone Encounter (Signed)
Verbal orders for PT, gait, balance.  [phone]

## 2015-09-10 NOTE — Telephone Encounter (Signed)
LM giving verbal orders

## 2015-09-12 ENCOUNTER — Other Ambulatory Visit: Payer: Self-pay | Admitting: Cardiology

## 2015-09-17 ENCOUNTER — Telehealth: Payer: Self-pay | Admitting: Family Medicine

## 2015-09-17 ENCOUNTER — Telehealth: Payer: Self-pay

## 2015-09-17 NOTE — Telephone Encounter (Signed)
Joanna Gay is calling to request verbal order for occupation therapy from Dr. Mackie PaiLauesntein. Please call! [phone]

## 2015-09-25 ENCOUNTER — Telehealth: Payer: Self-pay

## 2015-09-25 NOTE — Telephone Encounter (Signed)
Liji from home health called to let us now patient is falling a lot espically in the morning -recommended that she come in, but Liji stated that she had to consult with family member    Best number for Liji is [phone]

## 2015-09-25 NOTE — Telephone Encounter (Signed)
FYI

## 2015-10-03 ENCOUNTER — Telehealth: Payer: Self-pay

## 2015-10-03 NOTE — Telephone Encounter (Signed)
Dr L - please see forms that the patient's POA is requesting to be filled out.  I have left them in the nurses box.  Also need a prescription for a wheelchair for her.  Call Ann at [phone] about the script.  Fax forms to [phone]

## 2015-10-03 NOTE — Telephone Encounter (Signed)
Doolittle.

## 2015-10-05 NOTE — Telephone Encounter (Signed)
Ann called again and she was able to get a script for the wheelchair, just needs to hear something for sure on the paperwork.

## 2015-10-05 NOTE — Telephone Encounter (Signed)
Joanna Gay came by again about the below message.  This went from N[place]oolittle to Dr L, but none of our doctors have seen her much.  She does not have a PCP and [name] has a very hard time getting her here.  If nothing else, could we please place an order for a wheelchair?

## 2015-10-10 ENCOUNTER — Other Ambulatory Visit: Payer: Self-pay | Admitting: Cardiology

## 2015-10-11 ENCOUNTER — Ambulatory Visit (INDEPENDENT_AMBULATORY_CARE_PROVIDER_SITE_OTHER): Payer: Medicare Other | Admitting: Family Medicine

## 2015-10-11 VITALS — BP 159/77 | HR 75 | Temp 98.4°F | Resp 16 | Ht 64.0 in | Wt 127.0 lb

## 2015-10-11 DIAGNOSIS — L89623 Pressure ulcer of left heel, stage 3: Secondary | ICD-10-CM

## 2015-10-11 MED ORDER — MUPIROCIN 2 % EX OINT: 1.0000 "application " | TOPICAL_OINTMENT | Freq: Two times a day (BID) | CUTANEOUS | Status: DC

## 2015-10-11 NOTE — Progress Notes (Signed)
This is a 82 year old woman who was been accepted at a adult senior care center called [facility]. She needs forms filled out and she needs to have somebody evaluate her left heel pressure sore.  Daughter is here along with her private duty nurse. They report that she is sometimes confused but usually independent and of sound mind. She does have urinary incontinence at night. She needs help to prevent falls from when getting out of a wheelchair or other conveyance. She needs help with wound care, dressing, and transportation.  Patient tells me that she is somewhat hard of hearing and requires hearing aids. She does not need help with her vision.  Patient has had pressure sore in her left heel for over a week. She has a laceration which is healing from a February injury. There's been some erythema in that area but is less so today. Family has not been soaking or attending to her heel pain. Instead, she has a Engineer, drillingprivate duty nurse at does wound care.  Objective:BP 159/77 mmHg  Pulse 75  Temp(Src) 98.4 F (36.9 C) (Oral)  Resp 16  Ht 5\' 4"  (1.626 m)  Wt 127 lb (57.607 kg)  BMI 21.79 kg/m2  SpO2 97% HEENT: Patient does have hearing aids. Her vision seems reasonable grossly. She is edentulous Neck: Supple no adenopathy or thyromegaly Chest: Clear to auscultation Heart: Regular with a 2/6 systolic ejection type murmur Abdomen: Soft and nontender Extremities: 3-4 cm full-thickness eschar over the lateral aspect over left heel. Right medial forefoot shows healing laceration with minimal erythema. There is no drainage from either area. Patient has 2+ pedal edema on both feet. She has marked arthritic changes all fingers.  Assessment: Patient will be getting [facility]. Forms were filled out accordingly. I explained that I will be retiring in the next couple weeks. They will need to find a different primary care doctor. [facility] does provide residential physician  coverage.  Pressure sore on heel, left, stage III (HCC) - Plan: Quantiferon tb gold assay, mupirocin ointment (BACTROBAN) 2 %  Elvina SidleKurt Compton Brigance M.D.

## 2015-10-11 NOTE — Patient Instructions (Addendum)
Soak and then blow dry the left heel twice a day for the next month.  Remember to apply the antibiotic ointment after the soaks. Try to obtain lamb's wool heel pads to protect the heel.

## 2015-10-13 LAB — QUANTIFERON TB GOLD ASSAY (BLOOD)
Interferon Gamma Release Assay: NEGATIVE
Mitogen-Nil: >10 IU/mL
Quantiferon Nil Value: 0.03 IU/mL
Quantiferon Tb Ag Minus Nil Value: 0 IU/mL

## 2015-11-08 ENCOUNTER — Other Ambulatory Visit: Payer: Self-pay | Admitting: Cardiology

## 2015-11-12 ENCOUNTER — Telehealth: Payer: Self-pay | Admitting: Cardiology

## 2015-11-12 NOTE — Telephone Encounter (Signed)
Pt c/o swelling: STAT is pt has developed SOB within 24 hours  1. How long have you been experiencing swelling? 3 to 4 months 2. Where is the swelling located? Legs all the way down to feet 3.  Are you currently taking a "fluid pill"? yes  4.  Are you currently SOB? no 5.  Have you traveled recently? No    6-5 Dr.McLean pt, pt son wanted appt,agreed to mother seeing Lorin PicketScott 11-19-2015 as she has done before.

## 2015-11-12 NOTE — Telephone Encounter (Signed)
I attempted to contact pt at number listed [phone], I received recorded message this number was no longer in service.  I spoke with pt's son, Jimmey Ralpharker. I explained that I would need pt's verbal permission to speak with him since there was no DPR on file and asked for a working phone number for pt. Jimmey Ralpharker gave me [phone] to contact pt but asked me not to call pt because she was hard of hearing and could not communicate over the telephone.  Parker asked me why I was calling because he had been given an appointment for pt  and he was satisfied with that appointment.  I confirmed appt time for pt with Jimmey RalphParker. I advised Jimmey Ralpharker that a DPR could be completed by pt at time of appointment.

## 2015-11-18 NOTE — Progress Notes (Signed)
Cardiology Office Note:    Date:  11/19/2015   ID:  Joanna PoleHelen Emma Wauneka, DOB May 19, 1922, MRN [id number]  PCP:  Tonye PearsonOLITTLE, ROBERT P, MD  Cardiologist:  Dr. Marca Anconaalton McLean   Electrophysiologist:  n/a  Referring MD: Tonye Pearsonoolittle, Robert P, MD   Chief Complaint  Patient presents with  . Leg Swelling    History of Present Illness:     Joanna Gay is a 82 y.o. female with a hx of CAD with late-presentation anterior MI. Patient was admitted in 5/11 after having about 2 weeks of on and off severe chest pain. She was found to have a 95% proximal LAD stenosis on cath with akinetic myocardium in the LAD distribution. She had a PROMUS DES to the LAD and was treated for systolic CHF. Repeat echo in 9/11 showed that EF had normalized.   Last seen by Dr. Marca Anconaalton McLean 02/2013.  Last seen here by me in 1/16.  She returns for routine follow up as well as leg edema.  [facility]  She has had worsening LE edema over the past few mos.  She had a L heel sore.  She is going to the wound center next week.  She sleeps in a recliner b/c it is comfortable.  Denies chest pain, dyspnea. Denies orthopnea, PND.  Denies syncope. Denies cough, wheezing.     Past Medical History  Diagnosis Date  . Coronary artery disease     late presentation anterior MI in 5/11. LHC showed 95% proximal LAD stenosis.  Patient had a PROMUS DES to the LAD.  . Ischemic cardiomyopathy     Echo (5/11) showed EF 30% with anterior, anteroseptal, inferoseptal, and apex akinesis. There was restrictive diastolic function, moderate mitral regurgitation, and PA systolic pressure 59 mmHg. Repeat echo (9/11) with EF 60%, mild LVH, no regional wall motion abnormalities, PA systolic pressure 32 mmHg, mild MR.   Marland Kitchen. Hypertension   . Osteoarthritis   . Moderate mitral regurgitation by prior echocardiogram     moderate mitral regurgitation by echo 5/11 but only mild on repeat echo in 9/11.   Marland Kitchen. Hyperlipidemia     . S/P TAH-BSO   . History of tobacco use   . Myocardial infarction [facility](HCC)     Past Surgical History  Procedure Laterality Date  . Carotid stent insertion    . Total abdominal hysterectomy w/ bilateral salpingoophorectomy      Current Medications: Outpatient Prescriptions Prior to Visit  Medication Sig Dispense Refill  . aspirin EC 81 MG tablet Take 1 tablet (81 mg total) by mouth daily.    . carvedilol (COREG) 6.25 MG tablet TAKE 1 TABLET(6.25 MG) BY MOUTH TWICE DAILY WITH A MEAL 15 tablet 0  . enalapril (VASOTEC) 20 MG tablet TAKE 1 TABLET BY MOUTH TWICE DAILY 30 tablet 0  . rosuvastatin (CRESTOR) 20 MG tablet TAKE 1 TABLET BY MOUTH AT BEDTIME 30 tablet 6  . mupirocin ointment (BACTROBAN) 2 % Place 1 application into the nose 2 (two) times daily. (Patient not taking: Reported on 11/19/2015) 30 g 1   No facility-administered medications prior to visit.      Allergies:   Review of patient's allergies indicates no known allergies.   Social History   Social History  . Marital Status: Widowed    Spouse Name: N/A  . Number of Children: N/A  . Years of Education: N/A   Occupational History  . Retired     Runner, broadcasting/film/videoTeacher   Social History  Main Topics  . Smoking status: Former Smoker    Types: Cigarettes    Quit date: 10/17/1990  . Smokeless tobacco: Never Used  . Alcohol Use: No  . Drug Use: No  . Sexual Activity: Not Asked   Other Topics Concern  . None   Social History Narrative     Family History:  The patient's family history includes Heart attack in her brother; Stroke in her sister.   ROS:   Please see the history of present illness.    Review of Systems  HENT: Positive for hearing loss.   Cardiovascular: Positive for leg swelling.  Hematologic/Lymphatic: Positive for bleeding problem. Bruises/bleeds easily.  Neurological: Positive for loss of balance.  Psychiatric/Behavioral: Positive for depression.   All other systems reviewed and are negative.   Physical  Exam:    VS:  BP 120/60 mmHg  Pulse 70  Ht  (1.702 m)  Wt 127 lb (57.607 kg)  BMI 19.89 kg/m2   Physical Exam  Constitutional: She appears well-developed and well-nourished.  HENT:  Head: Normocephalic.  Neck: Normal range of motion. No JVD present.  Cardiovascular: Normal rate, regular rhythm and normal heart sounds.   No murmur heard. Pulmonary/Chest: She has decreased breath sounds. She has no wheezes.  Crackles at the bases bilaterally  Abdominal: Soft. There is no tenderness.  Musculoskeletal: Normal range of motion.  2-3+ bilateral LE edema up to the knee  Neurological: She is alert.  Skin: Skin is warm and dry.  Psychiatric: She has a normal mood and affect.    Wt Readings from Last 3 Encounters:  11/19/15 127 lb (57.607 kg)  10/11/15 127 lb (57.607 kg)  08/18/15 123 lb (55.792 kg)      Studies/Labs Reviewed:     EKG:  EKG is  ordered today.  The ekg ordered today demonstrates NSR, HR 71, leftward axis, QTc 452 ms, no change since last tracing  Recent Labs: 08/18/2015: ALT 31*; BUN 35*; Creat 1.18*; Hemoglobin 14.4; Potassium 4.2; Sodium 140   Recent Lipid Panel    Component Value Date/Time   CHOL 145 03/04/2013 0911   TRIG 144.0 03/04/2013 0911   HDL 52.80 03/04/2013 0911   CHOLHDL 3 03/04/2013 0911   VLDL 28.8 03/04/2013 0911   LDLCALC 63 03/04/2013 0911    Additional studies/ records that were reviewed today include:   - LHC (10/2009): p LAD 95, EF 35% >> PCI: 3 x 18 mm Promus DES to the proximal LAD - Echocardiogram (5/11): EF 30%, akinesis of the anterior, anteroseptal, inferoseptal and apical walls, moderate MR, mild LAE, PASP 59 mmHg, trivial effusion - Echo (9/11) with EF 60%, mild LVH, no regional wall motion abnormalities, PA systolic pressure 32 mmHg, mild MR.    ASSESSMENT:     1. Bilateral edema of lower extremity   2. CAD in native artery   3. HLD (hyperlipidemia)   4. Ischemic cardiomyopathy   5. Essential hypertension      PLAN:     In order of problems listed above:  1. Edema - Her mobility has significantly changed over the past 1 year.  She is now in ALF.  She has a hx of venous insufficiency.  The provider at her ALF has started her on Lasix.  She sees the wound clinic next week.  I suspect her edema is likely all related to venous insufficiency.  However, give her hx of anterior MI and prior reduced EF, I would like to rule out worsening LVF.  She wants to be as conservative as possible.    -  See wound clinic as planned. She may require leg wrapping  -  CMET, BNP today.  If BNP elevated, increase Lasix.    -  Arrange Echo to recheck LVEF.  2. CAD:  Out of hospital anterior MI in 2011 tx with DES to LAD. No angina.  Continue ASA, statin, Coreg, enalapril.   3. Hyperlipidemia:  Continue statin.  CMET today.  4. Ischemic cardiomyopathy: EF improved to normal on echo in 9/11. Recheck Echo as noted. Continue Coreg, enalapril.    5. HTN:  BP controlled.    Medication Adjustments/Labs and Tests Ordered: Current medicines are reviewed at length with the patient today.  Concerns regarding medicines are outlined above.  Medication changes, Labs and Tests ordered today are outlined in the Patient Instructions noted below. Patient Instructions  Medication Instructions:  Your physician recommends that you continue on your current medications as directed. Please refer to the Current Medication list given to you today. If you need a refill on your cardiac medications before your next appointment, please call your pharmacy. Labwork:  CMET  BNP   Testing/Procedures: IN ONE MONTH SAME DAY AS FOLLOW UP  Your physician has requested that you have an echocardiogram. Echocardiography is a painless test that uses sound waves to create images of your heart. It provides your doctor with information about the size and shape of your heart and how well your heart's chambers and valves are working. This procedure takes  approximately one hour. There are no restrictions for this procedure. Follow-Up: IN ONE MONTH WITH Toussaint Golson  Any Other Special Instructions Will Be Listed Below (If Applicable).   Signed, Tereso Newcomer, PA-C  11/19/2015 1:40 PM    [facility] [address] [place], [place], [place]  [address] Phone: [phone]; Fax: [phone]

## 2015-11-19 ENCOUNTER — Encounter: Payer: Self-pay | Admitting: Physician Assistant

## 2015-11-19 ENCOUNTER — Ambulatory Visit (INDEPENDENT_AMBULATORY_CARE_PROVIDER_SITE_OTHER): Payer: Medicare Other | Admitting: Physician Assistant

## 2015-11-19 VITALS — BP 120/60 | HR 70 | Ht 67.0 in | Wt 127.0 lb

## 2015-11-19 DIAGNOSIS — E785 Hyperlipidemia, unspecified: Secondary | ICD-10-CM

## 2015-11-19 DIAGNOSIS — I2510 Atherosclerotic heart disease of native coronary artery without angina pectoris: Secondary | ICD-10-CM | POA: Diagnosis not present

## 2015-11-19 DIAGNOSIS — I255 Ischemic cardiomyopathy: Secondary | ICD-10-CM

## 2015-11-19 DIAGNOSIS — R600 Localized edema: Secondary | ICD-10-CM

## 2015-11-19 DIAGNOSIS — I10 Essential (primary) hypertension: Secondary | ICD-10-CM

## 2015-11-19 MED ORDER — ROSUVASTATIN CALCIUM 20 MG PO TABS: 20.0000 mg | ORAL_TABLET | Freq: Every day | ORAL | Status: DC

## 2015-11-19 MED ORDER — ENALAPRIL MALEATE 20 MG PO TABS: 20.0000 mg | ORAL_TABLET | Freq: Two times a day (BID) | ORAL | Status: DC

## 2015-11-19 MED ORDER — CARVEDILOL 6.25 MG PO TABS: 6.2500 mg | ORAL_TABLET | Freq: Two times a day (BID) | ORAL | Status: DC

## 2015-11-19 NOTE — Patient Instructions (Addendum)
Medication Instructions:  Your physician recommends that you continue on your current medications as directed. Please refer to the Current Medication list given to you today. If you need a refill on your cardiac medications before your next appointment, please call your pharmacy. Labwork:  CMET  BNP   Testing/Procedures: IN ONE MONTH SAME DAY AS FOLLOW UP  Your physician has requested that you have an echocardiogram. Echocardiography is a painless test that uses sound waves to create images of your heart. It provides your doctor with information about the size and shape of your heart and how well your heart's chambers and valves are working. This procedure takes approximately one hour. There are no restrictions for this procedure. Follow-Up: IN ONE MONTH WITH WEAVER  Any Other Special Instructions Will Be Listed Below (If Applicable).

## 2015-11-20 LAB — COMPREHENSIVE METABOLIC PANEL
ALK PHOS: 198 U/L — AB (ref 33–130)
ALT: 13 U/L (ref 6–29)
AST: 22 U/L (ref 10–35)
Albumin: 3.5 g/dL — ABNORMAL LOW (ref 3.6–5.1)
BILIRUBIN TOTAL: 0.4 mg/dL (ref 0.2–1.2)
BUN: 29 mg/dL — AB (ref 7–25)
CO2: 25 mmol/L (ref 20–31)
CREATININE: 1.1 mg/dL — AB (ref 0.60–0.88)
Calcium: 8.9 mg/dL (ref 8.6–10.4)
Chloride: 104 mmol/L (ref 98–110)
GLUCOSE: 93 mg/dL (ref 65–99)
Potassium: 4.3 mmol/L (ref 3.5–5.3)
SODIUM: 142 mmol/L (ref 135–146)
Total Protein: 6.4 g/dL (ref 6.1–8.1)

## 2015-11-20 LAB — BRAIN NATRIURETIC PEPTIDE: Brain Natriuretic Peptide: 41.1 pg/mL (ref <100)

## 2015-11-22 ENCOUNTER — Encounter (HOSPITAL_BASED_OUTPATIENT_CLINIC_OR_DEPARTMENT_OTHER): Payer: Medicare Other | Attending: Internal Medicine

## 2015-11-22 ENCOUNTER — Other Ambulatory Visit: Payer: Self-pay | Admitting: Internal Medicine

## 2015-11-22 ENCOUNTER — Ambulatory Visit (HOSPITAL_COMMUNITY)
Admission: RE | Admit: 2015-11-22 | Discharge: 2015-11-22 | Disposition: A | Discharge status: Home / Self Care | Payer: Medicare Other | Source: Ambulatory Visit | Attending: Internal Medicine | Admitting: Internal Medicine

## 2015-11-22 DIAGNOSIS — Z87891 Personal history of nicotine dependence: Secondary | ICD-10-CM | POA: Insufficient documentation

## 2015-11-22 DIAGNOSIS — I252 Old myocardial infarction: Secondary | ICD-10-CM | POA: Diagnosis not present

## 2015-11-22 DIAGNOSIS — M1990 Unspecified osteoarthritis, unspecified site: Secondary | ICD-10-CM | POA: Insufficient documentation

## 2015-11-22 DIAGNOSIS — L89624 Pressure ulcer of left heel, stage 4: Secondary | ICD-10-CM | POA: Insufficient documentation

## 2015-11-22 DIAGNOSIS — I255 Ischemic cardiomyopathy: Secondary | ICD-10-CM | POA: Insufficient documentation

## 2015-11-22 DIAGNOSIS — I340 Nonrheumatic mitral (valve) insufficiency: Secondary | ICD-10-CM | POA: Insufficient documentation

## 2015-11-22 DIAGNOSIS — I2510 Atherosclerotic heart disease of native coronary artery without angina pectoris: Secondary | ICD-10-CM | POA: Insufficient documentation

## 2015-11-22 DIAGNOSIS — M86172 Other acute osteomyelitis, left ankle and foot: Secondary | ICD-10-CM

## 2015-11-22 DIAGNOSIS — X58XXXA Exposure to other specified factors, initial encounter: Secondary | ICD-10-CM | POA: Diagnosis not present

## 2015-11-22 DIAGNOSIS — I119 Hypertensive heart disease without heart failure: Secondary | ICD-10-CM | POA: Diagnosis not present

## 2015-11-22 DIAGNOSIS — M19072 Primary osteoarthritis, left ankle and foot: Secondary | ICD-10-CM | POA: Insufficient documentation

## 2015-11-22 DIAGNOSIS — S91302A Unspecified open wound, left foot, initial encounter: Secondary | ICD-10-CM | POA: Insufficient documentation

## 2015-11-22 DIAGNOSIS — Z955 Presence of coronary angioplasty implant and graft: Secondary | ICD-10-CM | POA: Insufficient documentation

## 2015-11-27 ENCOUNTER — Other Ambulatory Visit: Payer: Self-pay | Admitting: Cardiology

## 2015-11-29 DIAGNOSIS — L89624 Pressure ulcer of left heel, stage 4: Secondary | ICD-10-CM | POA: Diagnosis not present

## 2015-12-06 DIAGNOSIS — L89624 Pressure ulcer of left heel, stage 4: Secondary | ICD-10-CM | POA: Diagnosis not present

## 2015-12-08 ENCOUNTER — Other Ambulatory Visit: Payer: Self-pay | Admitting: Cardiology

## 2015-12-12 ENCOUNTER — Telehealth: Payer: Self-pay | Admitting: Physician Assistant

## 2015-12-12 NOTE — Telephone Encounter (Signed)
Called pt to schedule echo and pt didn't want to schedule

## 2015-12-12 NOTE — Telephone Encounter (Signed)
[address] Danis P[place]ch[place]aver, [place]   12/12/2015 5:34 PM

## 2015-12-13 ENCOUNTER — Encounter (HOSPITAL_BASED_OUTPATIENT_CLINIC_OR_DEPARTMENT_OTHER): Payer: Medicare Other | Attending: Internal Medicine

## 2015-12-13 DIAGNOSIS — L97811 Non-pressure chronic ulcer of other part of right lower leg limited to breakdown of skin: Secondary | ICD-10-CM | POA: Insufficient documentation

## 2015-12-13 DIAGNOSIS — I2510 Atherosclerotic heart disease of native coronary artery without angina pectoris: Secondary | ICD-10-CM | POA: Insufficient documentation

## 2015-12-13 DIAGNOSIS — I252 Old myocardial infarction: Secondary | ICD-10-CM | POA: Insufficient documentation

## 2015-12-13 DIAGNOSIS — I10 Essential (primary) hypertension: Secondary | ICD-10-CM | POA: Insufficient documentation

## 2015-12-13 DIAGNOSIS — X58XXXA Exposure to other specified factors, initial encounter: Secondary | ICD-10-CM | POA: Insufficient documentation

## 2015-12-13 DIAGNOSIS — S51802A Unspecified open wound of left forearm, initial encounter: Secondary | ICD-10-CM | POA: Diagnosis not present

## 2015-12-13 DIAGNOSIS — I87331 Chronic venous hypertension (idiopathic) with ulcer and inflammation of right lower extremity: Secondary | ICD-10-CM | POA: Insufficient documentation

## 2015-12-13 DIAGNOSIS — L89624 Pressure ulcer of left heel, stage 4: Secondary | ICD-10-CM | POA: Diagnosis not present

## 2015-12-13 DIAGNOSIS — I890 Lymphedema, not elsewhere classified: Secondary | ICD-10-CM | POA: Diagnosis not present

## 2015-12-13 DIAGNOSIS — M150 Primary generalized (osteo)arthritis: Secondary | ICD-10-CM | POA: Diagnosis not present

## 2015-12-13 DIAGNOSIS — I739 Peripheral vascular disease, unspecified: Secondary | ICD-10-CM | POA: Insufficient documentation

## 2015-12-24 DIAGNOSIS — L89624 Pressure ulcer of left heel, stage 4: Secondary | ICD-10-CM | POA: Diagnosis not present

## 2016-01-04 DIAGNOSIS — L89624 Pressure ulcer of left heel, stage 4: Secondary | ICD-10-CM | POA: Diagnosis not present

## 2016-01-17 ENCOUNTER — Encounter (HOSPITAL_BASED_OUTPATIENT_CLINIC_OR_DEPARTMENT_OTHER): Payer: Medicare Other | Attending: Internal Medicine

## 2016-01-17 DIAGNOSIS — L89624 Pressure ulcer of left heel, stage 4: Secondary | ICD-10-CM | POA: Diagnosis not present

## 2016-01-17 DIAGNOSIS — M1990 Unspecified osteoarthritis, unspecified site: Secondary | ICD-10-CM | POA: Insufficient documentation

## 2016-01-17 DIAGNOSIS — I252 Old myocardial infarction: Secondary | ICD-10-CM | POA: Insufficient documentation

## 2016-01-17 DIAGNOSIS — I87331 Chronic venous hypertension (idiopathic) with ulcer and inflammation of right lower extremity: Secondary | ICD-10-CM | POA: Insufficient documentation

## 2016-01-17 DIAGNOSIS — I2510 Atherosclerotic heart disease of native coronary artery without angina pectoris: Secondary | ICD-10-CM | POA: Diagnosis not present

## 2016-01-17 DIAGNOSIS — I10 Essential (primary) hypertension: Secondary | ICD-10-CM | POA: Insufficient documentation

## 2016-02-01 DIAGNOSIS — I87331 Chronic venous hypertension (idiopathic) with ulcer and inflammation of right lower extremity: Secondary | ICD-10-CM | POA: Diagnosis not present

## 2016-02-10 ENCOUNTER — Encounter (HOSPITAL_COMMUNITY): Payer: Self-pay | Admitting: Emergency Medicine

## 2016-02-10 ENCOUNTER — Emergency Department (HOSPITAL_COMMUNITY): Payer: Medicare Other

## 2016-02-10 ENCOUNTER — Emergency Department (HOSPITAL_COMMUNITY)
Admission: EM | Admit: 2016-02-10 | Discharge: 2016-02-10 | Disposition: A | Discharge status: Home / Self Care | Payer: Medicare Other | Attending: Emergency Medicine | Admitting: Emergency Medicine

## 2016-02-10 DIAGNOSIS — I252 Old myocardial infarction: Secondary | ICD-10-CM | POA: Diagnosis not present

## 2016-02-10 DIAGNOSIS — I2510 Atherosclerotic heart disease of native coronary artery without angina pectoris: Secondary | ICD-10-CM | POA: Insufficient documentation

## 2016-02-10 DIAGNOSIS — I110 Hypertensive heart disease with heart failure: Secondary | ICD-10-CM | POA: Insufficient documentation

## 2016-02-10 DIAGNOSIS — Z87891 Personal history of nicotine dependence: Secondary | ICD-10-CM | POA: Insufficient documentation

## 2016-02-10 DIAGNOSIS — R410 Disorientation, unspecified: Secondary | ICD-10-CM | POA: Insufficient documentation

## 2016-02-10 DIAGNOSIS — R4182 Altered mental status, unspecified: Secondary | ICD-10-CM | POA: Diagnosis present

## 2016-02-10 DIAGNOSIS — I5021 Acute systolic (congestive) heart failure: Secondary | ICD-10-CM | POA: Diagnosis not present

## 2016-02-10 DIAGNOSIS — W19XXXA Unspecified fall, initial encounter: Secondary | ICD-10-CM

## 2016-02-10 DIAGNOSIS — Y92009 Unspecified place in unspecified non-institutional (private) residence as the place of occurrence of the external cause: Secondary | ICD-10-CM

## 2016-02-10 LAB — CBC WITH DIFFERENTIAL/PLATELET
BASOS PCT: 1 %
Basophils Absolute: 0.1 10*3/uL (ref 0.0–0.1)
EOS ABS: 0.2 10*3/uL (ref 0.0–0.7)
Eosinophils Relative: 2 %
HEMATOCRIT: 38.5 % (ref 36.0–46.0)
Hemoglobin: 12.4 g/dL (ref 12.0–15.0)
Lymphocytes Relative: 20 %
Lymphs Abs: 1.6 10*3/uL (ref 0.7–4.0)
MCH: 29.5 pg (ref 26.0–34.0)
MCHC: 32.2 g/dL (ref 30.0–36.0)
MCV: 91.7 fL (ref 78.0–100.0)
MONO ABS: 0.8 10*3/uL (ref 0.1–1.0)
MONOS PCT: 10 %
Neutro Abs: 5.3 10*3/uL (ref 1.7–7.7)
Neutrophils Relative %: 67 %
Platelets: 232 10*3/uL (ref 150–400)
RBC: 4.2 MIL/uL (ref 3.87–5.11)
RDW: 15.2 % (ref 11.5–15.5)
WBC: 7.9 10*3/uL (ref 4.0–10.5)

## 2016-02-10 LAB — URINALYSIS, ROUTINE W REFLEX MICROSCOPIC
BILIRUBIN URINE: NEGATIVE
GLUCOSE, UA: NEGATIVE mg/dL
HGB URINE DIPSTICK: NEGATIVE
Ketones, ur: NEGATIVE mg/dL
Leukocytes, UA: NEGATIVE
Nitrite: NEGATIVE
PROTEIN: NEGATIVE mg/dL
SPECIFIC GRAVITY, URINE: 1.015 (ref 1.005–1.030)
pH: 5 (ref 5.0–8.0)

## 2016-02-10 LAB — BASIC METABOLIC PANEL
Anion gap: 6 (ref 5–15)
BUN: 27 mg/dL — AB (ref 6–20)
CALCIUM: 9.1 mg/dL (ref 8.9–10.3)
CO2: 27 mmol/L (ref 22–32)
CREATININE: 1.01 mg/dL — AB (ref 0.44–1.00)
Chloride: 106 mmol/L (ref 101–111)
GFR calc non Af Amer: 47 mL/min — ABNORMAL LOW (ref >60)
GFR, EST AFRICAN AMERICAN: 55 mL/min — AB (ref >60)
Glucose, Bld: 108 mg/dL — ABNORMAL HIGH (ref 65–99)
Potassium: 4.1 mmol/L (ref 3.5–5.1)
SODIUM: 139 mmol/L (ref 135–145)

## 2016-02-10 LAB — TROPONIN I

## 2016-02-10 MED ORDER — SODIUM CHLORIDE 0.9 % IV BOLUS (SEPSIS)
500.0000 mL | Freq: Once | INTRAVENOUS | Status: AC
  Administered 2016-02-10: 500 mL via INTRAVENOUS

## 2016-02-10 NOTE — Discharge Instructions (Signed)
°  All the results in the ER are normal. We are not sure what is causing your symptoms. The workup in the ER is not complete, and is limited to screening for life threatening and emergent conditions only, so please see a primary care doctor for further evaluation.  The urine is normal in appearance, it has been sent for cultures. If there is transient confusion, have the doctor look at her delerium as the cause and medications should be reviewed. If there are fevers, worsening confusion, vomiting - please return to the ER.

## 2016-02-10 NOTE — ED Triage Notes (Addendum)
Per EMS pt from [facility] assisted living for rolling off couch and falling around 0400 today, initially evaluated by EMS and refused transport to ED, now brought in for increased confusion onset 0800 today, per staff. Mental baseline is no dementia or confusion. Patient responds to voice, oriented x 4 to questions but exhibits some drowsiness and confusion. Started cephalexin treatment yesterday for UTI, family states current presentation is similar to past episodes of UTIs, family states confusion started yesterday. No anticoagulants.

## 2016-02-10 NOTE — ED Notes (Signed)
Bed: [id number] Expected date: 02/10/16 Expected time: 12:17 PM Means of arrival: Ambulance Comments: Fall early morning, now confused

## 2016-02-10 NOTE — ED Provider Notes (Signed)
[facility]-EMERGENCY DEPT Provider Note   CSN: [id number] Arrival date & time: 02/10/16  1219     History   Chief Complaint Chief Complaint  Patient presents with  . Fall  . Altered Mental Status    HPI Joanna Gay is a [age] y.o. female.  HPI  Pt comes in with cc of confusion. Pt has CHF, CAD and resides at a nursing home due to fall risk. Family at bedside, and reports that their mother has been more confused since Friday - she has been talking "out of her mind." She has had similar sx with UTI in the past. Today, she rolled over from her bed, but there was no severe trauma - and family doesn't want a CT - since even if there was a brain bleed pt wouldn't want anything done. Pt also has chronic LLE wound, being managed by outpatient doctor. No fevers at home. The wound is healing well.  Past Medical History:  Diagnosis Date  . Coronary artery disease    late presentation anterior MI in 5/11. LHC showed 95% proximal LAD stenosis.  Patient had a PROMUS DES to the LAD.  Marland Kitchen. History of tobacco use   . Hyperlipidemia   . Hypertension   . Ischemic cardiomyopathy    Echo (5/11) showed EF 30% with anterior, anteroseptal, inferoseptal, and apex akinesis. There was restrictive diastolic function, moderate mitral regurgitation, and PA systolic pressure 59 mmHg. Repeat echo (9/11) with EF 60%, mild LVH, no regional wall motion abnormalities, PA systolic pressure 32 mmHg, mild MR.   . Moderate mitral regurgitation by prior echocardiogram    moderate mitral regurgitation by echo 5/11 but only mild on repeat echo in 9/11.   . Myocardial infarction (HCC)   . Osteoarthritis   . S/P TAH-BSO     Patient Active Problem List   Diagnosis Date Noted  . Onychomycosis due to dermatophyte 09/21/2014  . HYPERLIPIDEMIA-MIXED 11/29/2009  . CAD, NATIVE VESSEL 11/29/2009  . MITRAL REGURGITATION, MODERATE 11/28/2009  . HYPERTENSION 11/28/2009  . MYOCARDIAL INFARCTION, ACUTE, SUBENDOCARDIAL  11/28/2009  . ISCHEMIC CARDIOMYOPATHY 11/28/2009  . CONGESTIVE HEART FAILURE, ACUTE, SYSTOLIC 11/28/2009  . OSTEOARTHRITIS 11/28/2009  . TOBACCO USE, QUIT 11/28/2009    Past Surgical History:  Procedure Laterality Date  . CAROTID STENT INSERTION    . TOTAL ABDOMINAL HYSTERECTOMY W/ BILATERAL SALPINGOOPHORECTOMY      OB History    No data available       Home Medications    Prior to Admission medications   Medication Sig Start Date End Date Taking? Authorizing Provider  aspirin EC 81 MG tablet Take 1 tablet (81 mg total) by mouth daily. 10/18/10   Laurey Moralealton S McLean, MD  carvedilol (COREG) 6.25 MG tablet Take 1 tablet (6.25 mg total) by mouth 2 (two) times daily with a meal. 11/19/15   Beatrice LecherScott T Weaver, PA-C  enalapril (VASOTEC) 20 MG tablet Take 1 tablet (20 mg total) by mouth 2 (two) times daily. 11/19/15   Beatrice LecherScott T Weaver, PA-C  furosemide (LASIX) 20 MG tablet Take 20 mg by mouth every morning.    Historical Provider, MD  rosuvastatin (CRESTOR) 20 MG tablet Take 1 tablet (20 mg total) by mouth daily. 11/19/15   Beatrice LecherScott T Weaver, PA-C    Family History Family History  Problem Relation Age of Onset  . Heart attack Brother   . Stroke Sister     Social History Social History  Substance Use Topics  . Smoking status: Former Smoker  Types: Cigarettes    Quit date: 10/17/1990  . Smokeless tobacco: Never Used  . Alcohol use No     Allergies   Review of patient's allergies indicates no known allergies.   Review of Systems Review of Systems  Unable to perform ROS: Mental status change     Physical Exam Updated Vital Signs BP 122/70   Pulse 70   Temp 98.2 F (36.8 C) (Oral)   Resp 20   SpO2 99%   Physical Exam  Constitutional: She appears well-developed.  HENT:  Head: Normocephalic and atraumatic.  Eyes: EOM are normal.  Neck: Normal range of motion. Neck supple.  Cardiovascular: Normal rate.   Pulmonary/Chest: Effort normal.  Abdominal: Bowel sounds are normal.  She exhibits no distension. There is no tenderness.  Musculoskeletal:  Head to toe evaluation shows no hematoma, bleeding of the scalp, no facial abrasions, step offs, crepitus, no tenderness to palpation of the bilateral upper and lower extremities, no gross deformities, no chest tenderness, no pelvic pain.   Neurological: She is alert.  Oriented to self and place.  Skin: Skin is warm and dry. There is erythema.  RLE is edematous and erythematous (chronic per family).  Nursing note and vitals reviewed.    ED Treatments / Results  Labs (all labs ordered are listed, but only abnormal results are displayed) Labs Reviewed  BASIC METABOLIC PANEL - Abnormal; Notable for the following:       Result Value   Glucose, Bld 108 (*)    BUN 27 (*)    Creatinine, Ser 1.01 (*)    GFR calc non Af Amer 47 (*)    GFR calc Af Amer 55 (*)    All other components within normal limits  URINE CULTURE  CBC WITH DIFFERENTIAL/PLATELET  TROPONIN I  URINALYSIS, ROUTINE W REFLEX MICROSCOPIC (NOT AT [facility])    EKG  EKG Interpretation None       Radiology No results found.  Procedures Procedures (including critical care time)  Medications Ordered in ED Medications  sodium chloride 0.9 % bolus 500 mL (0 mLs Intravenous Stopped 02/10/16 1737)     Initial Impression / Assessment and Plan / ED Course  I have reviewed the triage vital signs and the nursing notes.  Pertinent labs & imaging results that were available during my care of the patient were reviewed by me and considered in my medical decision making (see chart for details).  Clinical Course    Pt comes in w/ cc of confusion. She is on antibiotics for presumed UTI - 3 doses in. Pt was still not acting like her usual self, so patient's family brought her to the ER. Pt has a chronic healing would on the LLE, doing well, and she has venous stasis on the LLE. Pt has no fevers. There was a fall - roll from a bed, prior to ER arrival. But  family reports that the mechanism was low, pt had no trauma to the head, and even if there was SDH, pt would not want any surgery, so CT head was ordered, but cancelled per family's request. UA is neg. It is equivocal given the keflex that she is taking. Results discussed. Labs are reassuring. Pt hydrated. Family reports that pt is doing a lot better in the ER here than she was at assisted living. There is no cough, dib - so no chest xrays ordered.  Pt's family ok with not getting CT head still. They will advise nursing home to reassess patient  q 2-4 hours. Strict ER return precautions have been discussed, and patient's family is agreeing with the plan and is comfortable with the workup done and the recommendations from the ER. They have also been asked to have the patient get evaluated by the NP/MD at the nursing home.  Final Clinical Impressions(s) / ED Diagnoses   Final diagnoses:  Confusion  Fall at home, initial encounter    New Prescriptions Discharge Medication List as of 02/10/2016  4:57 PM       Derwood Kaplan, MD 02/10/16 1744

## 2016-02-11 LAB — URINE CULTURE: Culture: NO GROWTH

## 2016-02-12 ENCOUNTER — Telehealth (HOSPITAL_BASED_OUTPATIENT_CLINIC_OR_DEPARTMENT_OTHER): Payer: Self-pay | Admitting: Emergency Medicine

## 2016-02-22 ENCOUNTER — Encounter (HOSPITAL_BASED_OUTPATIENT_CLINIC_OR_DEPARTMENT_OTHER): Payer: Medicare Other | Attending: Internal Medicine

## 2016-02-22 DIAGNOSIS — I10 Essential (primary) hypertension: Secondary | ICD-10-CM | POA: Diagnosis not present

## 2016-02-22 DIAGNOSIS — I2510 Atherosclerotic heart disease of native coronary artery without angina pectoris: Secondary | ICD-10-CM | POA: Diagnosis not present

## 2016-02-22 DIAGNOSIS — I739 Peripheral vascular disease, unspecified: Secondary | ICD-10-CM | POA: Insufficient documentation

## 2016-02-22 DIAGNOSIS — I252 Old myocardial infarction: Secondary | ICD-10-CM | POA: Insufficient documentation

## 2016-02-22 DIAGNOSIS — L89624 Pressure ulcer of left heel, stage 4: Secondary | ICD-10-CM | POA: Diagnosis not present

## 2016-02-22 DIAGNOSIS — I890 Lymphedema, not elsewhere classified: Secondary | ICD-10-CM | POA: Diagnosis not present

## 2016-02-22 DIAGNOSIS — M150 Primary generalized (osteo)arthritis: Secondary | ICD-10-CM | POA: Insufficient documentation

## 2016-03-13 ENCOUNTER — Encounter (HOSPITAL_BASED_OUTPATIENT_CLINIC_OR_DEPARTMENT_OTHER): Payer: Medicare Other | Attending: Internal Medicine

## 2016-03-13 DIAGNOSIS — S81812A Laceration without foreign body, left lower leg, initial encounter: Secondary | ICD-10-CM | POA: Insufficient documentation

## 2016-03-13 DIAGNOSIS — M1990 Unspecified osteoarthritis, unspecified site: Secondary | ICD-10-CM | POA: Insufficient documentation

## 2016-03-13 DIAGNOSIS — I252 Old myocardial infarction: Secondary | ICD-10-CM | POA: Insufficient documentation

## 2016-03-13 DIAGNOSIS — L97812 Non-pressure chronic ulcer of other part of right lower leg with fat layer exposed: Secondary | ICD-10-CM | POA: Diagnosis not present

## 2016-03-13 DIAGNOSIS — X58XXXA Exposure to other specified factors, initial encounter: Secondary | ICD-10-CM | POA: Insufficient documentation

## 2016-03-13 DIAGNOSIS — I87331 Chronic venous hypertension (idiopathic) with ulcer and inflammation of right lower extremity: Secondary | ICD-10-CM | POA: Insufficient documentation

## 2016-03-13 DIAGNOSIS — I2510 Atherosclerotic heart disease of native coronary artery without angina pectoris: Secondary | ICD-10-CM | POA: Diagnosis not present

## 2016-04-08 ENCOUNTER — Other Ambulatory Visit: Payer: Self-pay | Admitting: Physician Assistant

## 2016-04-10 ENCOUNTER — Encounter (HOSPITAL_BASED_OUTPATIENT_CLINIC_OR_DEPARTMENT_OTHER): Payer: Medicare Other | Attending: Internal Medicine

## 2016-05-19 ENCOUNTER — Other Ambulatory Visit: Payer: Self-pay | Admitting: Physician Assistant

## 2016-05-25 ENCOUNTER — Other Ambulatory Visit: Payer: Self-pay | Admitting: Physician Assistant

## 2016-06-05 ENCOUNTER — Ambulatory Visit: Payer: Medicare Other | Admitting: Podiatry

## 2016-06-11 ENCOUNTER — Other Ambulatory Visit: Payer: Self-pay | Admitting: Physician Assistant

## 2016-07-09 ENCOUNTER — Other Ambulatory Visit: Payer: Self-pay | Admitting: Physician Assistant

## 2016-07-21 ENCOUNTER — Emergency Department (HOSPITAL_COMMUNITY)
Admission: EM | Admit: 2016-07-21 | Discharge: 2016-07-21 | Disposition: A | Discharge status: Home / Self Care | Payer: Medicare Other | Attending: Emergency Medicine | Admitting: Emergency Medicine

## 2016-07-21 ENCOUNTER — Emergency Department (HOSPITAL_COMMUNITY): Payer: Medicare Other

## 2016-07-21 ENCOUNTER — Encounter (HOSPITAL_COMMUNITY): Payer: Self-pay | Admitting: Emergency Medicine

## 2016-07-21 DIAGNOSIS — Z87891 Personal history of nicotine dependence: Secondary | ICD-10-CM | POA: Diagnosis not present

## 2016-07-21 DIAGNOSIS — I5021 Acute systolic (congestive) heart failure: Secondary | ICD-10-CM | POA: Insufficient documentation

## 2016-07-21 DIAGNOSIS — Z7982 Long term (current) use of aspirin: Secondary | ICD-10-CM | POA: Diagnosis not present

## 2016-07-21 DIAGNOSIS — I110 Hypertensive heart disease with heart failure: Secondary | ICD-10-CM | POA: Diagnosis not present

## 2016-07-21 DIAGNOSIS — R5381 Other malaise: Secondary | ICD-10-CM | POA: Diagnosis not present

## 2016-07-21 DIAGNOSIS — I2510 Atherosclerotic heart disease of native coronary artery without angina pectoris: Secondary | ICD-10-CM | POA: Diagnosis not present

## 2016-07-21 DIAGNOSIS — I252 Old myocardial infarction: Secondary | ICD-10-CM | POA: Diagnosis not present

## 2016-07-21 DIAGNOSIS — R4701 Aphasia: Secondary | ICD-10-CM | POA: Insufficient documentation

## 2016-07-21 LAB — CBC WITH DIFFERENTIAL/PLATELET
Basophils Absolute: 0 10*3/uL (ref 0.0–0.1)
Basophils Relative: 1 %
EOS ABS: 0.2 10*3/uL (ref 0.0–0.7)
EOS PCT: 2 %
HCT: 42.5 % (ref 36.0–46.0)
Hemoglobin: 13.6 g/dL (ref 12.0–15.0)
LYMPHS ABS: 1.7 10*3/uL (ref 0.7–4.0)
Lymphocytes Relative: 24 %
MCH: 29.2 pg (ref 26.0–34.0)
MCHC: 32 g/dL (ref 30.0–36.0)
MCV: 91.2 fL (ref 78.0–100.0)
MONO ABS: 0.6 10*3/uL (ref 0.1–1.0)
MONOS PCT: 8 %
Neutro Abs: 4.7 10*3/uL (ref 1.7–7.7)
Neutrophils Relative %: 65 %
PLATELETS: 208 10*3/uL (ref 150–400)
RBC: 4.66 MIL/uL (ref 3.87–5.11)
RDW: 16.3 % — ABNORMAL HIGH (ref 11.5–15.5)
WBC: 7.2 10*3/uL (ref 4.0–10.5)

## 2016-07-21 LAB — PROTIME-INR
INR: 1.11
PROTHROMBIN TIME: 14.3 s (ref 11.4–15.2)

## 2016-07-21 LAB — CBG MONITORING, ED: GLUCOSE-CAPILLARY: 90 mg/dL (ref 65–99)

## 2016-07-21 LAB — I-STAT CHEM 8, ED
BUN: 22 mg/dL — ABNORMAL HIGH (ref 6–20)
CALCIUM ION: 1.14 mmol/L — AB (ref 1.15–1.40)
CREATININE: 1.1 mg/dL — AB (ref 0.44–1.00)
Chloride: 105 mmol/L (ref 101–111)
Glucose, Bld: 90 mg/dL (ref 65–99)
HCT: 41 % (ref 36.0–46.0)
Hemoglobin: 13.9 g/dL (ref 12.0–15.0)
Potassium: 3.7 mmol/L (ref 3.5–5.1)
Sodium: 142 mmol/L (ref 135–145)
TCO2: 26 mmol/L (ref 0–100)

## 2016-07-21 LAB — URINALYSIS, ROUTINE W REFLEX MICROSCOPIC
BILIRUBIN URINE: NEGATIVE
Glucose, UA: NEGATIVE mg/dL
Ketones, ur: NEGATIVE mg/dL
LEUKOCYTES UA: NEGATIVE
Nitrite: NEGATIVE
Protein, ur: NEGATIVE mg/dL
SPECIFIC GRAVITY, URINE: 1.013 (ref 1.005–1.030)
pH: 5 (ref 5.0–8.0)

## 2016-07-21 LAB — APTT: aPTT: 30 seconds (ref 24–36)

## 2016-07-21 LAB — I-STAT TROPONIN, ED: Troponin i, poc: 0.01 ng/mL (ref 0.00–0.08)

## 2016-07-21 LAB — ETHANOL: Alcohol, Ethyl (B): <5 mg/dL (ref <5)

## 2016-07-21 MED ORDER — SODIUM CHLORIDE 0.9 % IV BOLUS (SEPSIS)
500.0000 mL | Freq: Once | INTRAVENOUS | Status: AC
  Administered 2016-07-21: 500 mL via INTRAVENOUS

## 2016-07-21 MED ORDER — SODIUM CHLORIDE 0.9 % IV SOLN
100.0000 mL/h | INTRAVENOUS | Status: DC
Start: 2016-07-21 — End: 2016-07-21
  Administered 2016-07-21: 100 mL/h via INTRAVENOUS

## 2016-07-21 NOTE — Code Documentation (Signed)
83 [facility].o. [facility]. The patient is from [facility]. She has prior UTI dx with treatment in progress. Per EMS the pt's UTI treatment ended today and she has still not recovered. Thus the MD at her facility decided to have her admitted today. EMS arrived on scene and appreciated a left facial droop and slurred speech and subsequently a code stroke was called. Per EMS, one employee stated the facial droop was her baseline and another employee stated it was new. However the slurred speech was agreed to be not her normal. [facility], the pt was greeted and assessed by EDP and Neuro MD. The decision was made to cancel code stroke d/t presentation and assessment not being consistent with stroke. CT / NIHSS not performed per Neuro MD. Bedside handoff with ED RN Kennyth ArnoldStacy

## 2016-07-21 NOTE — Discharge Instructions (Signed)
No UTI on urine. No symptoms consistent with UTI either. Will not empirically treat.

## 2016-07-21 NOTE — ED Provider Notes (Signed)
[facility]-EMERGENCY DEPT Provider Note   CSN: [id number] Arrival date & time: 07/21/16  1512   An emergency department physician performed an initial assessment on this suspected stroke patient at 1501.  History   Chief Complaint Chief Complaint  Patient presents with  . Aphasia    HPI Joanna Gay is a 83 y.o. female.  HPI Patient presents for facility after concerns aphasia. Less than normal at 9:30. This morning. Patient was noted to have slurred speech and was unable to speak with facial droop. Some people at the facility per EMS were stating that this was her baseline, others that this was not. When EMS arrived they did appreciate a left sided droop. This has since subsided. No acute events in transport. Patient does not recall the event. Patient had a weakness, numbness, urinary frequency or dysuria no chest pain shortness of breath. States she otherwise feels well and has no headache. Symptoms have completely resolved without intervention. Tourniquet glucose was 90 in triage.  Spoke with caregiver at [employer], Wanita Chamberlain. She was rx with keflex, and it didn't get better (only sx is combativeness and tired) and then then was started on cipro, although she has not been taking it for last 2 days. No fevers.   January - 10 days of keflex Cipro started on 9th - she has had 3 doses  She states pt always has intermittent slurry speech and droopy face. This is not usual. NP actually sent her for for concern UTI.  Pt does drink wine daily  On ASA  Past Medical History:  Diagnosis Date  . Coronary artery disease    late presentation anterior MI in 5/11. LHC showed 95% proximal LAD stenosis.  Patient had a PROMUS DES to the LAD.  Marland Kitchen History of tobacco use   . Hyperlipidemia   . Hypertension   . Ischemic cardiomyopathy    Echo (5/11) showed EF 30% with anterior, anteroseptal, inferoseptal, and apex akinesis. There was restrictive diastolic function, moderate mitral  regurgitation, and PA systolic pressure 59 mmHg. Repeat echo (9/11) with EF 60%, mild LVH, no regional wall motion abnormalities, PA systolic pressure 32 mmHg, mild MR.   . Moderate mitral regurgitation by prior echocardiogram    moderate mitral regurgitation by echo 5/11 but only mild on repeat echo in 9/11.   . Myocardial infarction   . Osteoarthritis   . S/P TAH-BSO     Patient Active Problem List   Diagnosis Date Noted  . Onychomycosis due to dermatophyte 09/21/2014  . HYPERLIPIDEMIA-MIXED 11/29/2009  . CAD, NATIVE VESSEL 11/29/2009  . MITRAL REGURGITATION, MODERATE 11/28/2009  . HYPERTENSION 11/28/2009  . MYOCARDIAL INFARCTION, ACUTE, SUBENDOCARDIAL 11/28/2009  . ISCHEMIC CARDIOMYOPATHY 11/28/2009  . CONGESTIVE HEART FAILURE, ACUTE, SYSTOLIC 11/28/2009  . OSTEOARTHRITIS 11/28/2009  . TOBACCO USE, QUIT 11/28/2009    Past Surgical History:  Procedure Laterality Date  . CAROTID STENT INSERTION    . TOTAL ABDOMINAL HYSTERECTOMY W/ BILATERAL SALPINGOOPHORECTOMY      OB History    No data available       Home Medications    Prior to Admission medications   Medication Sig Start Date End Date Taking? Authorizing Provider  aspirin EC 81 MG tablet Take 1 tablet (81 mg total) by mouth daily. 10/18/10  Yes Laurey Morale, MD  carvedilol (COREG) 6.25 MG tablet TAKE 1 TABLET BY MOUTH TWICE DAILY WITH A MEAL 05/26/16  Yes Beatrice Lecher, PA-C  ciprofloxacin (CIPRO) 250 MG tablet Take 250 mg by mouth  every 12 (twelve) hours. 5 day course started 07/17/16 afternoon   Yes Historical Provider, MD  citalopram (CELEXA) 10 MG tablet Take 10 mg by mouth daily.   Yes Historical Provider, MD  CRANBERRY PO Take 1 capsule by mouth at bedtime.   Yes Historical Provider, MD  enalapril (VASOTEC) 20 MG tablet TAKE 1 TABLET BY MOUTH TWICE DAILY 05/20/16  Yes Beatrice Lecher, PA-C  furosemide (LASIX) 20 MG tablet TAKE 1 TABLET BY MOUTH EVERY MORNING 04/08/16  Yes Beatrice Lecher, PA-C  rosuvastatin  (CRESTOR) 10 MG tablet Take 10 mg by mouth at bedtime.   Yes Historical Provider, MD  rosuvastatin (CRESTOR) 20 MG tablet TAKE 1 TABLET BY MOUTH DAILY Patient not taking: Reported on 07/21/2016 07/10/16   Beatrice Lecher, PA-C    Family History Family History  Problem Relation Age of Onset  . Heart attack Brother   . Stroke Sister     Social History Social History  Substance Use Topics  . Smoking status: Former Smoker    Types: Cigarettes    Quit date: 10/17/1990  . Smokeless tobacco: Never Used  . Alcohol use No     Allergies   Patient has no known allergies.   Review of Systems Review of Systems  Constitutional: Negative for fever.  Respiratory: Negative for shortness of breath.   Cardiovascular: Negative for chest pain.  Genitourinary: Negative for pelvic pain.  Allergic/Immunologic: Negative for immunocompromised state.  All other systems reviewed and are negative.    Physical Exam Updated Vital Signs BP 166/69 (BP Location: Right Arm) Comment: Simultaneous filing. User may not have seen previous data. Comment (BP Location): Simultaneous filing. User may not have seen previous data.  Pulse (!) 59 Comment: Simultaneous filing. User may not have seen previous data.  Temp 98.6 F (37 C) (Oral)   Resp 16 Comment: Simultaneous filing. User may not have seen previous data.  Ht 5\' 6"  (1.676 m)   Wt 56.7 kg   SpO2 98% Comment: Simultaneous filing. User may not have seen previous data.  BMI 20.18 kg/m   Physical Exam  Constitutional: She is oriented to person, place, and time. She appears well-developed and well-nourished. No distress.  HENT:  Head: Normocephalic and atraumatic.  Eyes: Conjunctivae are normal.  Neck: Neck supple.  Cardiovascular: Normal rate and regular rhythm.   No murmur heard. Pulmonary/Chest: Effort normal and breath sounds normal. No respiratory distress.  Abdominal: Soft. There is no tenderness.  Musculoskeletal: She exhibits no edema.    Neurological: She is alert and oriented to person, place, and time. She has normal reflexes. She displays no atrophy, no tremor and normal reflexes. No cranial nerve deficit or sensory deficit. She exhibits normal muscle tone. She displays no seizure activity. Coordination normal.  However, does not know month - she does know year Slurred speech  Skin: Skin is warm and dry.  Psychiatric: She has a normal mood and affect.  Nursing note and vitals reviewed.    ED Treatments / Results  Labs (all labs ordered are listed, but only abnormal results are displayed) Labs Reviewed  CBC WITH DIFFERENTIAL/PLATELET - Abnormal; Notable for the following:       Result Value   RDW 16.3 (*)    All other components within normal limits  URINALYSIS, ROUTINE W REFLEX MICROSCOPIC - Abnormal; Notable for the following:    APPearance CLOUDY (*)    Hgb urine dipstick SMALL (*)    Bacteria, UA RARE (*)  Squamous Epithelial / LPF 6-30 (*)    All other components within normal limits  I-STAT CHEM 8, ED - Abnormal; Notable for the following:    BUN 22 (*)    Creatinine, Ser 1.10 (*)    Calcium, Ion 1.14 (*)    All other components within normal limits  APTT  PROTIME-INR  ETHANOL  CBG MONITORING, ED  I-STAT TROPOININ, ED    EKG  EKG Interpretation None       Radiology Ct Head Wo Contrast  Result Date: 07/21/2016 CLINICAL DATA:  Slurred  speech, left-sided facial droop EXAM: CT HEAD WITHOUT CONTRAST TECHNIQUE: Contiguous axial images were obtained from the base of the skull through the vertex without intravenous contrast. COMPARISON:  None available FINDINGS: Brain: Diffuse parenchymal atrophy. Patchy areas of hypoattenuation in deep and periventricular white matter bilaterally. Small lacunar infarcts in bilateral basal ganglia. Negative for acute intracranial hemorrhage, mass lesion, acute infarction, midline shift, or mass-effect. Acute infarct may be inapparent on noncontrast CT. Ventricles and  sulci symmetric. Vascular: Atherosclerotic and physiologic intracranial calcifications. Skull: Bone windows demonstrate no focal lesion. Sinuses/Orbits: No acute finding. Other: None. IMPRESSION: 1. Negative for bleed or other acute intracranial process. 2. Atrophy and nonspecific white matter changes. Electronically Signed   By: Corlis Leak  Hassell M.D.   On: 07/21/2016 16:05    Procedures Procedures (including critical care time)  Medications Ordered in ED Medications  sodium chloride 0.9 % bolus 500 mL (500 mLs Intravenous New Bag/Given 07/21/16 1714)    Followed by  0.9 %  sodium chloride infusion (100 mL/hr Intravenous New Bag/Given 07/21/16 1714)     Initial Impression / Assessment and Plan / ED Course  I have reviewed the triage vital signs and the nursing notes.  Pertinent labs & imaging results that were available during my care of the patient were reviewed by me and considered in my medical decision making (see chart for details).     Symptoms are not consistent with a stroke. Slurred speech and facial asymmetry are chronic and not evident on exam. Possibly related to intermittent alcohol use that is endorsed by caregivers. Patient neurological exam currently is unremarkable except for not knowing the month. I was unable to walk her due to physical deconditioning. However, there are no endorsed symptoms of stroke at this time. Patient was actually sent due to concern of intermittent combativeness that is ongoing, thought to be related to UTI. However, patient has been treated multiple times. Patient does have known dementia so this may be a new baseline for slow progression. Urine currently without UTI. No evidence of other source of delirium and patient does not appear delirious here. Labs obtained and chemistry unremarkable, CBC without white count, troponin negative, CT head negative for acute abnormality. Patient safe for discharge home with close follow-up. Cipro is well known to cause some  mental status changes and delirium but spoke with daughter, she states prior to taking meds. She agrees pt at baseline now. Pt does have slurred speech  Now that I evaluate her, but daughter states baseline.   Final Clinical Impressions(s) / ED Diagnoses   Final diagnoses:  Aphasia  Physical deconditioning    New Prescriptions New Prescriptions   No medications on file     Sidney AceAlison Charruf Adalena Abdulla, MD 07/21/16 1845    Gerhard Munchobert Lockwood, MD 07/22/16 [phone]

## 2016-07-21 NOTE — ED Triage Notes (Signed)
Per EMS, patient was last seen normal at 0930am.  At approx 1400 patient seen with slurred speech and left sided facial droop.  Some witnesses say slurred speech and facial droop normal.  Patient has hx of UTI.  120/82, 60 HR, RR16, CBG 113.  2-18G IVs.  Code Stroke cancelled.

## 2016-07-23 LAB — URINE CULTURE: Culture: NO GROWTH

## 2016-08-26 ENCOUNTER — Emergency Department (HOSPITAL_COMMUNITY): Payer: Medicare Other

## 2016-08-26 ENCOUNTER — Encounter (HOSPITAL_COMMUNITY): Payer: Self-pay

## 2016-08-26 ENCOUNTER — Observation Stay (HOSPITAL_COMMUNITY)
Admission: EM | Admit: 2016-08-26 | Discharge: 2016-08-27 | Disposition: A | Discharge status: Skilled Nursing Facility | Payer: Medicare Other | Attending: Infectious Disease | Admitting: Infectious Disease

## 2016-08-26 DIAGNOSIS — Z87440 Personal history of urinary (tract) infections: Secondary | ICD-10-CM | POA: Insufficient documentation

## 2016-08-26 DIAGNOSIS — I340 Nonrheumatic mitral (valve) insufficiency: Secondary | ICD-10-CM | POA: Diagnosis not present

## 2016-08-26 DIAGNOSIS — F02818 Dementia in other diseases classified elsewhere, unspecified severity, with other behavioral disturbance: Secondary | ICD-10-CM

## 2016-08-26 DIAGNOSIS — M1990 Unspecified osteoarthritis, unspecified site: Secondary | ICD-10-CM | POA: Insufficient documentation

## 2016-08-26 DIAGNOSIS — Z87891 Personal history of nicotine dependence: Secondary | ICD-10-CM | POA: Insufficient documentation

## 2016-08-26 DIAGNOSIS — L97921 Non-pressure chronic ulcer of unspecified part of left lower leg limited to breakdown of skin: Secondary | ICD-10-CM | POA: Insufficient documentation

## 2016-08-26 DIAGNOSIS — Z7401 Bed confinement status: Secondary | ICD-10-CM | POA: Diagnosis not present

## 2016-08-26 DIAGNOSIS — I2510 Atherosclerotic heart disease of native coronary artery without angina pectoris: Secondary | ICD-10-CM | POA: Diagnosis not present

## 2016-08-26 DIAGNOSIS — L8990 Pressure ulcer of unspecified site, unspecified stage: Secondary | ICD-10-CM | POA: Insufficient documentation

## 2016-08-26 DIAGNOSIS — Z7982 Long term (current) use of aspirin: Secondary | ICD-10-CM | POA: Diagnosis not present

## 2016-08-26 DIAGNOSIS — Z66 Do not resuscitate: Secondary | ICD-10-CM | POA: Insufficient documentation

## 2016-08-26 DIAGNOSIS — E785 Hyperlipidemia, unspecified: Secondary | ICD-10-CM | POA: Insufficient documentation

## 2016-08-26 DIAGNOSIS — Z7289 Other problems related to lifestyle: Secondary | ICD-10-CM

## 2016-08-26 DIAGNOSIS — R5383 Other fatigue: Principal | ICD-10-CM | POA: Insufficient documentation

## 2016-08-26 DIAGNOSIS — E860 Dehydration: Secondary | ICD-10-CM | POA: Insufficient documentation

## 2016-08-26 DIAGNOSIS — I110 Hypertensive heart disease with heart failure: Secondary | ICD-10-CM | POA: Insufficient documentation

## 2016-08-26 DIAGNOSIS — G301 Alzheimer's disease with late onset: Secondary | ICD-10-CM | POA: Diagnosis not present

## 2016-08-26 DIAGNOSIS — I252 Old myocardial infarction: Secondary | ICD-10-CM | POA: Diagnosis not present

## 2016-08-26 DIAGNOSIS — R410 Disorientation, unspecified: Secondary | ICD-10-CM

## 2016-08-26 DIAGNOSIS — R001 Bradycardia, unspecified: Secondary | ICD-10-CM | POA: Diagnosis not present

## 2016-08-26 DIAGNOSIS — I509 Heart failure, unspecified: Secondary | ICD-10-CM | POA: Diagnosis not present

## 2016-08-26 DIAGNOSIS — I255 Ischemic cardiomyopathy: Secondary | ICD-10-CM | POA: Diagnosis not present

## 2016-08-26 DIAGNOSIS — F0281 Dementia in other diseases classified elsewhere with behavioral disturbance: Secondary | ICD-10-CM | POA: Diagnosis not present

## 2016-08-26 DIAGNOSIS — R4182 Altered mental status, unspecified: Secondary | ICD-10-CM

## 2016-08-26 DIAGNOSIS — Z789 Other specified health status: Secondary | ICD-10-CM

## 2016-08-26 DIAGNOSIS — I10 Essential (primary) hypertension: Secondary | ICD-10-CM | POA: Diagnosis present

## 2016-08-26 HISTORY — DX: Heart failure, unspecified: I50.9

## 2016-08-26 LAB — URINALYSIS, ROUTINE W REFLEX MICROSCOPIC
Bacteria, UA: NONE SEEN
Bilirubin Urine: NEGATIVE
GLUCOSE, UA: NEGATIVE mg/dL
Ketones, ur: 5 mg/dL — AB
Leukocytes, UA: NEGATIVE
NITRITE: NEGATIVE
PH: 5 (ref 5.0–8.0)
PROTEIN: NEGATIVE mg/dL
SPECIFIC GRAVITY, URINE: 1.016 (ref 1.005–1.030)

## 2016-08-26 LAB — CBC WITH DIFFERENTIAL/PLATELET
BASOS ABS: 0 10*3/uL (ref 0.0–0.1)
Basophils Relative: 1 %
EOS PCT: 1 %
Eosinophils Absolute: 0.1 10*3/uL (ref 0.0–0.7)
HEMATOCRIT: 39.4 % (ref 36.0–46.0)
Hemoglobin: 12.8 g/dL (ref 12.0–15.0)
LYMPHS ABS: 1.4 10*3/uL (ref 0.7–4.0)
LYMPHS PCT: 21 %
MCH: 29.8 pg (ref 26.0–34.0)
MCHC: 32.5 g/dL (ref 30.0–36.0)
MCV: 91.8 fL (ref 78.0–100.0)
MONO ABS: 0.7 10*3/uL (ref 0.1–1.0)
Monocytes Relative: 10 %
NEUTROS ABS: 4.4 10*3/uL (ref 1.7–7.7)
Neutrophils Relative %: 67 %
PLATELETS: 180 10*3/uL (ref 150–400)
RBC: 4.29 MIL/uL (ref 3.87–5.11)
RDW: 15.9 % — ABNORMAL HIGH (ref 11.5–15.5)
WBC: 6.6 10*3/uL (ref 4.0–10.5)

## 2016-08-26 LAB — I-STAT TROPONIN, ED: Troponin i, poc: 0.01 ng/mL (ref 0.00–0.08)

## 2016-08-26 LAB — COMPREHENSIVE METABOLIC PANEL
ALT: 14 U/L (ref 14–54)
AST: 26 U/L (ref 15–41)
Albumin: 3.1 g/dL — ABNORMAL LOW (ref 3.5–5.0)
Alkaline Phosphatase: 115 U/L (ref 38–126)
Anion gap: 8 (ref 5–15)
BILIRUBIN TOTAL: 1.1 mg/dL (ref 0.3–1.2)
BUN: 10 mg/dL (ref 6–20)
CALCIUM: 9.1 mg/dL (ref 8.9–10.3)
CHLORIDE: 105 mmol/L (ref 101–111)
CO2: 28 mmol/L (ref 22–32)
CREATININE: 0.91 mg/dL (ref 0.44–1.00)
GFR, EST NON AFRICAN AMERICAN: 53 mL/min — AB (ref >60)
Glucose, Bld: 95 mg/dL (ref 65–99)
Potassium: 3.8 mmol/L (ref 3.5–5.1)
Sodium: 141 mmol/L (ref 135–145)
TOTAL PROTEIN: 6.2 g/dL — AB (ref 6.5–8.1)

## 2016-08-26 LAB — I-STAT CG4 LACTIC ACID, ED
LACTIC ACID, VENOUS: 1.21 mmol/L (ref 0.5–1.9)
LACTIC ACID, VENOUS: 1.34 mmol/L (ref 0.5–1.9)

## 2016-08-26 LAB — VITAMIN B12: Vitamin B-12: 733 pg/mL (ref 180–914)

## 2016-08-26 LAB — TSH: TSH: 2.296 u[IU]/mL (ref 0.350–4.500)

## 2016-08-26 MED ORDER — ASPIRIN EC 81 MG PO TBEC
81.0000 mg | DELAYED_RELEASE_TABLET | Freq: Every day | ORAL | Status: DC
  Administered 2016-08-27: 81 mg via ORAL
  Filled 2016-08-26: qty 1

## 2016-08-26 MED ORDER — SODIUM CHLORIDE 0.9 % IV BOLUS (SEPSIS)
500.0000 mL | Freq: Once | INTRAVENOUS | Status: AC
  Administered 2016-08-26: 500 mL via INTRAVENOUS

## 2016-08-26 MED ORDER — ENOXAPARIN SODIUM 40 MG/0.4ML ~~LOC~~ SOLN
40.0000 mg | SUBCUTANEOUS | Status: DC
  Administered 2016-08-26: 40 mg via SUBCUTANEOUS
  Filled 2016-08-26: qty 0.4

## 2016-08-26 MED ORDER — ACETAMINOPHEN 325 MG PO TABS: 650.0000 mg | ORAL_TABLET | Freq: Four times a day (QID) | ORAL | Status: DC | PRN

## 2016-08-26 MED ORDER — SODIUM CHLORIDE 0.9 % IV SOLN
INTRAVENOUS | Status: AC
  Administered 2016-08-26 – 2016-08-27 (×2): via INTRAVENOUS

## 2016-08-26 MED ORDER — ACETAMINOPHEN 650 MG RE SUPP: 650.0000 mg | Freq: Four times a day (QID) | RECTAL | Status: DC | PRN

## 2016-08-26 NOTE — ED Triage Notes (Signed)
[facility]- pt here from [facility] with complaint of altered mental status. She has gotten progressively worse over the last day. She has had UTI X3 weeks and started Macrobid yesterday. Has been taking Keflex. Pt alert to voice on arrival.

## 2016-08-26 NOTE — ED Notes (Signed)
Pt combative when attempting to collect blood hitting and spitting at staff.

## 2016-08-26 NOTE — H&P (Signed)
Date: 08/26/2016               Patient Name:  Joanna Gay MRN: [id number]  DOB: 11/10/1921 Age / Sex: 83 y.o., female   PCP: Manus Gunning, FNP         Medical Service: Internal Medicine Teaching Service         Attending Physician: Dr. Randall Hiss, MD    First Contact: Dr. Noemi Chapel Pager: [phone]  Second Contact: Dr. Orest Dikes Pager: [phone]       After Hours (After 5p/  First Contact Pager: [phone]  weekends / holidays): Second Contact Pager: [phone]   Chief Complaint: Lethargy  History of Present Illness:  Joanna Gay is a frail 83 year old female with medical history of CAD with Hx of MI, HLD and HTN who presents from [facility] for evaluation of confusion and lethargy for 2 days. Daughter Joanna Gay who was present at bedside reports the patient was at her baseline on Sunday evening with perhaps a little confusion. Notes the patient is at baseline quite conversant and intelligent however over the past year has been experiencing periods of lethargy usually following UTI's. Daughter reports frequent urinary tract infections over the past few months and chart review shows an ED note which mentioned January had UTI treated with 10 days of Keflex and started on cipro after lack of clinical improvement. Daughter confirms this and notes she was to be started on Macrobid yesterday however this was not administered per patients daughter. Daughter denies noticing any diarrhea, nausea, vomiting, headache or any other complaints per patient. Did observe 1 wet cough over the past few days however otherwise nothing out of the ordinary.   In the ED temperature was 98.2*, pulse 57, BP 156/95, respiratory rate 18 and she was saturating 98% on RA. CMET showed decreased albumin at 3.1. CBC was without leukocytosis (WBC 6.6) and without anemia (Hb 12.8). Lactic acid 1.2, troponin 0.01. UA negative for infection. CXR with mild left base atelectasis however without edema  or consolidation. 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 She was subsequently started on gentle IV rehydration and admitted to IMTS for observation and management of AMS.   Meds:  Current Meds  Medication Sig  . aspirin EC 81 MG tablet Take 1 tablet (81 mg total) by mouth daily.  . carvedilol (COREG) 6.25 MG tablet TAKE 1 TABLET BY MOUTH TWICE DAILY WITH A MEAL  . citalopram (CELEXA) 10 MG tablet Take 10 mg by mouth daily.  . enalapril (VASOTEC) 20 MG tablet TAKE 1 TABLET BY MOUTH TWICE DAILY  . furosemide (LASIX) 20 MG tablet TAKE 1 TABLET BY MOUTH EVERY MORNING  . rosuvastatin (CRESTOR) 10 MG tablet Take 10 mg by mouth at bedtime.   Allergies: Allergies as of 08/26/2016  . (No Known Allergies)   Past Medical History:  Diagnosis Date  . CHF (congestive heart failure) (HCC)   . Coronary artery disease    late presentation anterior MI in 5/11. LHC showed 95% proximal LAD stenosis.  Patient had a PROMUS DES to the LAD.  Marland Kitchen History of tobacco use   . Hyperlipidemia   . Hypertension   . Ischemic cardiomyopathy    Echo (5/11) showed EF 30% with anterior, anteroseptal, inferoseptal, and apex akinesis. There was restrictive diastolic function, moderate mitral regurgitation, and PA systolic pressure 59 mmHg. Repeat echo (9/11)  with EF 60%, mild LVH, no regional wall motion abnormalities, PA systolic pressure 32 mmHg, mild MR.   . Moderate mitral regurgitation by prior echocardiogram    moderate mitral regurgitation by echo 5/11 but only mild on repeat echo in 9/11.   . Myocardial infarction   . Osteoarthritis   . S/P TAH-BSO   Family History: Sister with CVA. Brother with MI.  Social History: Former smoker but quit in 1992. Lives in SNF. Has daughter, Joanna Gay, who is very involved in her care.  Review of Systems: A complete ROS was negative except as per HPI.  Physical Exam: Blood pressure  (!) 147/119, pulse (!) 53, temperature 98.2 F (36.8 C), temperature source Rectal, resp. rate 18, SpO2 96 %. General: Frail appearing caucasian woman resting in bed in ED. Daughter present at bedside. In no acute distress however lethargic and difficult to arouse.  HENT: [place], AT. Mucous membranes dry.  Cardiovascular: Bradycardic with rate of ~50. No murmur or rub appreciated. Pulmonary: CTA BL, no wheezing or crackles were appreciated. Good air movement. Unlabored breathing.  Abdomen: Soft, thin,non-tender and non-distended. No guarding or rigidity. Extremities: No peripheral edema noted BL. No gross deformities. BL LE wrapped in clean dry compression dressings.  Skin: Warm, dry. No cyanosis.  Neuro: Lethargic and difficult to arouse. Arouses to verbal stimuli however quickly falls asleep. Unable to complete strength or sensory testing due to mental status.  Psych: Unable to assess due to patients AMS.    EKG: Sinus bradycardia with rate of 48. No ST segment elevations.   CXR: Mild left base atelectasis without edema or consolidation. Cardiomegaly. Aortic atherosclerosis.   CT HEAD w/o Contrast: No intracranial hemorrhage or acute infarct. Moderate chronic microvascular changes with moderate global atrophy. Pituitary mass suspicious for macroadenoma.  Urinalysis    Component Value Date/Time   COLORURINE YELLOW 08/26/2016 1522   APPEARANCEUR CLEAR 08/26/2016 1522   LABSPEC 1.016 08/26/2016 1522   PHURINE 5.0 08/26/2016 1522   GLUCOSEU NEGATIVE 08/26/2016 1522   HGBUR SMALL (A) 08/26/2016 1522   BILIRUBINUR NEGATIVE 08/26/2016 1522   KETONESUR 5 (A) 08/26/2016 1522   PROTEINUR NEGATIVE 08/26/2016 1522   NITRITE NEGATIVE 08/26/2016 1522   LEUKOCYTESUR NEGATIVE 08/26/2016 1522   CBC    Component Value Date/Time   WBC 6.6 08/26/2016 1437   RBC 4.29 08/26/2016 1437   HGB 12.8 08/26/2016 1437   HCT 39.4 08/26/2016 1437   PLT 180 08/26/2016 1437   MCV 91.8 08/26/2016 1437   MCV  89.0 08/18/2015 1636   MCH 29.8 08/26/2016 1437   MCHC 32.5 08/26/2016 1437   RDW 15.9 (H) 08/26/2016 1437   LYMPHSABS 1.4 08/26/2016 1437   MONOABS 0.7 08/26/2016 1437   EOSABS 0.1 08/26/2016 1437   BASOSABS 0.0 08/26/2016 1437   CMP Latest Ref Rng & Units 08/26/2016 07/21/2016 02/10/2016  Glucose 65 - 99 mg/dL 95 90 960(A)  BUN 6 - 20 mg/dL 10 54(U) 98(J)  Creatinine 0.44 - 1.00 mg/dL 1.91 4.78(G) 9.56(O)  Sodium 135 - 145 mmol/L 141 142 139  Potassium 3.5 - 5.1 mmol/L 3.8 3.7 4.1  Chloride 101 - 111 mmol/L 105 105 106  CO2 22 - 32 mmol/L 28 - 27  Calcium 8.9 - 10.3 mg/dL 9.1 - 9.1  Total Protein 6.5 - 8.1 g/dL 6.2(L) - -  Total Bilirubin 0.3 - 1.2 mg/dL 1.1 - -  Alkaline Phos 38 - 126 U/L 115 - -  AST 15 - 41 U/L 26 - -  ALT  14 - 54 U/L 14 - -   Assessment & Plan by Problem: Principal Problem:   Lethargy Active Problems:   Essential hypertension  Lethargy: In this frail 83 year old female with recurrent urinary tract infections. UA and CXR without sign of infection. She is afebrile, not tachycardic nor does she appear toxic on exam. Lactic acid normal. Daughter reports pt was last normal Sunday evening however pt might have been slightly confused. At baseline, patient is quite conversant and intelligent. Daughter reports patient is lethargic when she has UTI or is dehydrated and wonders about persistent infection however no WBCs, no bacteria, no leukocyte esterase and no nitrates were observed. She was treated recently for UTI with Keflex and Cipro and was supposed to be started on Macrobid however daughter does not believe she received this. Daughter did mention history that her mother was 'swabbed' due to repeat UTIs which showed 2 bugs who were resistant to bacteria. There is no documentation of this in the EMR and daughter unable to recall bacteria. 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  Will gently rehydrate patient and observe for declaration of disease process. At this point, doubt PNA or UTI. Patient does NOT appear septic.  -Will gently rehydrate patient with ns @ 75 ml/hr overnight. 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 Daughter denies pt complaining of any visual changes or headaches and in addition CT did not show involvement of optic chiasm or nerves and there does not appear to be mass effect. Hyperprolactinemia is not typically associated with lethargy and neither does elevated ACTH typically. I doubt these are related however will continue to monitor and keep in differential.  -TSH as above  CHF: No ECHO in our EMR however patient on lasix 20 mg PO at her SNF. She appears dry and dehydrated on exam and will hold lasix presently however will monitor volume status closely in setting of rehydration in this frail elderly patient. -Holding lasix 20 mg PO currently in setting of dehyrdation  Hypertension: On Enalapril 20 mg and Coreg 6.25 mg at SNF. We will hold both of these medications upon admission however I wonder if patients Coreg could be the source of her lethargy given her bradycardia. Will need to re-evaluate the necessity of this medication in this patient if she were to perk up while holding and her HR were to normalize.  -Hold Enalapril 20 mg daily -- this could be added back should the patient become persistently hypertensive -Hold Coreg 6.25 mg daily  HLD: On Crestor 20 mg at home. This will be held on admission. Will need to re-evaluate the benefit of this medication in this frail 83 year old woman. Statins have been associated with fatigue in the elderly.  -Hold Crestor 20 mg. Consider d/c this  medication  Code Status: DNR - This was confirmed with the patients daughter, Joanna Holenne, who is her HCPOA.  Diet: Soft IVF: Received 500 cc NS bolus in ED. Gentle rehydration with ns @ 75 mL/hr overnight DVT prophylaxis: Lovenox  Dispo: Admit patient to Observation with expected length of stay less than 2 midnights.  SignedNoemi Chapel: Anniah Glick, DO 08/26/2016, 6:02 PM  Pager: [phone]

## 2016-08-26 NOTE — ED Notes (Signed)
Pt transported to CT ?

## 2016-08-26 NOTE — ED Notes (Signed)
hospitalist at bedside

## 2016-08-26 NOTE — ED Provider Notes (Signed)
[facility]-EMERGENCY DEPT Provider Note   CSN: [id number] Arrival date & time: 08/26/16  1325     History   Chief Complaint Chief Complaint  Patient presents with  . Altered Mental Status    HPI Joanna Gay is a 83 y.o. female.  HPI Joanna Gay is a 83 y.o. female with hx of CHF, CAD, HTN, recurrent UTIs, presents to ED with AMS. Pt is form [facility] with progressive confusion over the last 2 days. Daughter is at bedside. Daughter states patient was at her baseline on Sunday with maybe mild confusion, states yesterday was visited by a nurse who reported that he was more confused, and today patient is not answering questions or following directions. Patient has had recurrent UTI for the last 3 weeks. Initially treated with Cipro, has also been on Keflex, yesterday switched to Macrobid. She does not think that patient has started taking Macrobid yet. The daughter states that this is not patient's normal. As far as she knows, patient has not been complaining of anything. She has had a mild cough over the last 2 days. No other symptoms.  Past Medical History:  Diagnosis Date  . CHF (congestive heart failure) (HCC)   . Coronary artery disease    late presentation anterior MI in 5/11. LHC showed 95% proximal LAD stenosis.  Patient had a PROMUS DES to the LAD.  Marland Kitchen History of tobacco use   . Hyperlipidemia   . Hypertension   . Ischemic cardiomyopathy    Echo (5/11) showed EF 30% with anterior, anteroseptal, inferoseptal, and apex akinesis. There was restrictive diastolic function, moderate mitral regurgitation, and PA systolic pressure 59 mmHg. Repeat echo (9/11) with EF 60%, mild LVH, no regional wall motion abnormalities, PA systolic pressure 32 mmHg, mild MR.   . Moderate mitral regurgitation by prior echocardiogram    moderate mitral regurgitation by echo 5/11 but only mild on repeat echo in 9/11.   . Myocardial infarction   . Osteoarthritis   . S/P TAH-BSO      Patient Active Problem List   Diagnosis Date Noted  . Onychomycosis due to dermatophyte 09/21/2014  . HYPERLIPIDEMIA-MIXED 11/29/2009  . CAD, NATIVE VESSEL 11/29/2009  . MITRAL REGURGITATION, MODERATE 11/28/2009  . HYPERTENSION 11/28/2009  . MYOCARDIAL INFARCTION, ACUTE, SUBENDOCARDIAL 11/28/2009  . ISCHEMIC CARDIOMYOPATHY 11/28/2009  . CONGESTIVE HEART FAILURE, ACUTE, SYSTOLIC 11/28/2009  . OSTEOARTHRITIS 11/28/2009  . TOBACCO USE, QUIT 11/28/2009    Past Surgical History:  Procedure Laterality Date  . CAROTID STENT INSERTION    . TOTAL ABDOMINAL HYSTERECTOMY W/ BILATERAL SALPINGOOPHORECTOMY      OB History    No data available       Home Medications    Prior to Admission medications   Medication Sig Start Date End Date Taking? Authorizing Provider  aspirin EC 81 MG tablet Take 1 tablet (81 mg total) by mouth daily. 10/18/10   Laurey Morale, MD  carvedilol (COREG) 6.25 MG tablet TAKE 1 TABLET BY MOUTH TWICE DAILY WITH A MEAL 05/26/16   Beatrice Lecher, PA-C  ciprofloxacin (CIPRO) 250 MG tablet Take 250 mg by mouth every 12 (twelve) hours. 5 day course started 07/17/16 afternoon    Historical Provider, MD  citalopram (CELEXA) 10 MG tablet Take 10 mg by mouth daily.    Historical Provider, MD  CRANBERRY PO Take 1 capsule by mouth at bedtime.    Historical Provider, MD  enalapril (VASOTEC) 20 MG tablet TAKE 1 TABLET BY MOUTH TWICE  DAILY 05/20/16   Beatrice LecherScott T Weaver, PA-C  furosemide (LASIX) 20 MG tablet TAKE 1 TABLET BY MOUTH EVERY MORNING 04/08/16   Beatrice LecherScott T Weaver, PA-C  rosuvastatin (CRESTOR) 10 MG tablet Take 10 mg by mouth at bedtime.    Historical Provider, MD  rosuvastatin (CRESTOR) 20 MG tablet TAKE 1 TABLET BY MOUTH DAILY Patient not taking: Reported on 07/21/2016 07/10/16   Beatrice LecherScott T Weaver, PA-C    Family History Family History  Problem Relation Age of Onset  . Heart attack Brother   . Stroke Sister     Social History Social History  Substance Use Topics  .  Smoking status: Former Smoker    Types: Cigarettes    Quit date: 10/17/1990  . Smokeless tobacco: Never Used  . Alcohol use No     Allergies   Patient has no known allergies.   Review of Systems Review of Systems  Unable to perform ROS: Mental status change     Physical Exam Updated Vital Signs BP (!) 149/97   Pulse (!) 57   Temp 98.2 F (36.8 C) (Rectal)   Resp 18   SpO2 98%   Physical Exam  Constitutional: She appears well-developed and well-nourished.  HENT:  Head: Normocephalic.  Eyes: Conjunctivae are normal.  Neck: Neck supple.  Cardiovascular: Normal rate and regular rhythm.   Murmur heard. Pulmonary/Chest: Effort normal and breath sounds normal. No respiratory distress. She has no wheezes. She has no rales.  Abdominal: Soft. Bowel sounds are normal. She exhibits no distension.  Musculoskeletal: She exhibits no edema.  Neurological: She is alert.  Does not follow directions or responds approprietly. Pt moving bilateral upper extremities. Responds to pain  Skin: Skin is warm and dry.  Psychiatric: She has a normal mood and affect. Her behavior is normal.  Nursing note and vitals reviewed.    ED Treatments / Results  Labs (all labs ordered are listed, but only abnormal results are displayed) Labs Reviewed  URINE CULTURE  CULTURE, BLOOD (ROUTINE X 2)  CULTURE, BLOOD (ROUTINE X 2)  CBC WITH DIFFERENTIAL/PLATELET  COMPREHENSIVE METABOLIC PANEL  URINALYSIS, ROUTINE W REFLEX MICROSCOPIC  I-STAT CG4 LACTIC ACID, ED    EKG  EKG Interpretation None       Radiology No results found.  Procedures Procedures (including critical care time)  Medications Ordered in ED Medications - No data to display   Initial Impression / Assessment and Plan / ED Course  I have reviewed the triage vital signs and the nursing notes.  Pertinent labs & imaging results that were available during my care of the patient were reviewed by me and considered in my medical  decision making (see chart for details).   patient in emergency department for worsening altered mental status. Daughter bedside providing most of history. Patient is afebrile, normal vital signs, no evidence of sepsis at this time. Will check labs, blood cultures, will check UA. Chest x-ray for cough. Will monitor.  4:10 PM Labs without significant abnormalities, urinalysis negative for infection. Patient daughter initially refused CT scan, however had a discussion with her, and since we still do not know the cause of her altered mental status, she agreed to have a CT scan performed. Patient continues to be altered, confused, does not follow directions, wakes up to sound. She is bradycardic, otherwise normal vital signs.   Pt signed out to Dr. Jacqulyn BathLong pending CT head and admission.   Vitals:   08/26/16 2000 08/26/16 2025 08/27/16 0646 08/27/16 1416  BP:  Marland Kitchen(!)  147/57 105/82 (!) 161/65  Pulse:  61 63 61  Resp:  (!) 22 18 18   Temp:  97.9 F (36.6 C) 98.5 F (36.9 C) 98.1 F (36.7 C)  TempSrc:    Oral  SpO2:  100%  96%  Weight: 62.1 kg     Height: 5\' 3"  (1.6 m)         Final Clinical Impressions(s) / ED Diagnoses   Final diagnoses:  Altered mental status, unspecified altered mental status type    New Prescriptions New Prescriptions   No medications on file     Jaynie Crumble, PA-C 08/27/16 1539    Jerelyn Scott, MD 08/29/16 [phone]

## 2016-08-26 NOTE — ED Notes (Signed)
Family made aware of pts bed assignment 

## 2016-08-26 NOTE — ED Notes (Signed)
Family requesting no CT at this time due to patient just had a CT last month. PA aware.

## 2016-08-26 NOTE — ED Notes (Signed)
Pt returned to room and placed back on monitor.  

## 2016-08-27 DIAGNOSIS — I10 Essential (primary) hypertension: Secondary | ICD-10-CM | POA: Diagnosis not present

## 2016-08-27 DIAGNOSIS — Z8249 Family history of ischemic heart disease and other diseases of the circulatory system: Secondary | ICD-10-CM

## 2016-08-27 DIAGNOSIS — R410 Disorientation, unspecified: Secondary | ICD-10-CM

## 2016-08-27 DIAGNOSIS — Z7289 Other problems related to lifestyle: Secondary | ICD-10-CM

## 2016-08-27 DIAGNOSIS — F0281 Dementia in other diseases classified elsewhere with behavioral disturbance: Secondary | ICD-10-CM

## 2016-08-27 DIAGNOSIS — E237 Disorder of pituitary gland, unspecified: Secondary | ICD-10-CM | POA: Diagnosis not present

## 2016-08-27 DIAGNOSIS — F02818 Dementia in other diseases classified elsewhere, unspecified severity, with other behavioral disturbance: Secondary | ICD-10-CM

## 2016-08-27 DIAGNOSIS — R001 Bradycardia, unspecified: Secondary | ICD-10-CM

## 2016-08-27 DIAGNOSIS — I2510 Atherosclerotic heart disease of native coronary artery without angina pectoris: Secondary | ICD-10-CM | POA: Diagnosis not present

## 2016-08-27 DIAGNOSIS — G301 Alzheimer's disease with late onset: Secondary | ICD-10-CM

## 2016-08-27 DIAGNOSIS — R5383 Other fatigue: Secondary | ICD-10-CM

## 2016-08-27 DIAGNOSIS — Z823 Family history of stroke: Secondary | ICD-10-CM

## 2016-08-27 DIAGNOSIS — Z87891 Personal history of nicotine dependence: Secondary | ICD-10-CM | POA: Diagnosis not present

## 2016-08-27 DIAGNOSIS — Z66 Do not resuscitate: Secondary | ICD-10-CM | POA: Diagnosis not present

## 2016-08-27 DIAGNOSIS — R4182 Altered mental status, unspecified: Secondary | ICD-10-CM

## 2016-08-27 DIAGNOSIS — E785 Hyperlipidemia, unspecified: Secondary | ICD-10-CM | POA: Diagnosis not present

## 2016-08-27 DIAGNOSIS — L8990 Pressure ulcer of unspecified site, unspecified stage: Secondary | ICD-10-CM | POA: Insufficient documentation

## 2016-08-27 DIAGNOSIS — Z789 Other specified health status: Secondary | ICD-10-CM | POA: Diagnosis not present

## 2016-08-27 LAB — URINE CULTURE: Culture: NO GROWTH

## 2016-08-27 LAB — GLUCOSE, CAPILLARY: GLUCOSE-CAPILLARY: 80 mg/dL (ref 65–99)

## 2016-08-27 LAB — MRSA PCR SCREENING: MRSA BY PCR: NEGATIVE

## 2016-08-27 MED ORDER — ENALAPRIL MALEATE 10 MG PO TABS
10.0000 mg | ORAL_TABLET | Freq: Every day | ORAL | Status: DC
  Filled 2016-08-27: qty 1

## 2016-08-27 NOTE — Progress Notes (Signed)
   Subjective: Joanna Gay was seen and evaluated today at bedside this morning. She was in bed reading the news paper. Her son in law was present at bedside and reports she appears to be near her baseline. Again echoed the history obtained last night that she has been declining since having frequent UTIs over the past year. He reports she is no longer able to care for herself, is mostly bedbound and in general has declined in terms of her functional status.   Objective:  Vital signs in last 24 hours: Vitals:   08/26/16 2000 08/26/16 2025 08/27/16 0646 08/27/16 1416  BP:  (!) 147/57 105/82 (!) 161/65  Pulse:  61 63 61  Resp:  (!) 22 18 18   Temp:  97.9 F (36.6 C) 98.5 F (36.9 C) 98.1 F (36.7 C)  TempSrc:    Oral  SpO2:  100%  96%  Weight: 137 lb (62.1 kg)     Height: 5\' 3"  (1.6 m)      General: Very pleasant but hard-of-hearing frail woman resting in bed reading newspaper. In no acute distress. HENT: EOMI. No conjunctival injection, icterus or ptosis. Oropharynx clear. Mucous membranes moist. Cardiovascular: Regular rate and rhythm. No murmur or rub appreciated. Pulmonary: CTA BL on anterior chest. Breathing unlabored.  Abdomen: Soft, non-tender and non-distended. +bowel sounds.  Extremities:  Skin: Warm, dry. No cyanosis. Thorough skin exam showed 1 small skin break on LLE which is pictured below. No purulence. No sacral ulcers.   Psych: Alert and knew she was in hospital however did not know which one. Knew was near her facility. Pleasant and interactive.      Assessment/Plan:  Principal Problem:   Lethargy Active Problems:   Essential hypertension   Pressure injury of skin  Lethargy: This is a 83 y/o frail F who presented last night with lethargy in the setting of overall functional decline for the past year following frequent bouts of UTIs. No clear sign of infection with a negative UA, negative CXR, no leukocytosis and she is not febrile nor tachycardic. She seems  to have responded to gentle rehydration and was without complaint today however did have some AMS yesterday evening. In addition, report from RN and fellow resident indicate that the patient was again altered this afternoon, asking for us to call her parents, etc. Overall, I still am suspicious that there may be an underlying process and believe pt would continue to benefit from close observation as I do feel she is a risk for bouncing back quickly after discharge if underlying issue is not declared.  -Keep overnight  -IVF to keep BP stable -Continuing to hold statin and beta-blocker as patients HR still remains quite low in the 60's.  -TSH and B12 normal  CHF: No echo in our system. She does have some lower extremity edema on exam however son in law reports this is actually much better than usual. Will continue to hold lasix in setting of dehydration.   Hypertension: On Enalapril 20 mg and Coreg 6.25mg  at her facility. Both were held on admission however BP appears to be creeping up. Will resume Enalapril at lower dose today and will continue to monitor. Could consider increasing to home dose if BP continues to rise.  -Continue to hold Coreg in setting of bradycardia. This medication will likely need to be d/c upon d/c -Enalapril 10 mg daily  Dispo: Anticipated discharge in approximately 1 day(s).   Joanna Orebaugh, DO 08/27/2016, 4:59 PM Pager: [phone]

## 2016-08-27 NOTE — Progress Notes (Signed)
  Re-paged by RN saying that the family was at bedside and family asking to discharge patient.   Evaluated at bedside and son was present. Son said that patient's mental status seems to be at baseline. Son said that daughter Thurston Holenne was also in agreement. They said that patient's mental status seems to be at baseline and has been like this for the last 2 months. Patient has had progressive worsening of both functional and mental status over the last year. Patient has 'good days and bad days' and the 'bad days' are about once a week.   We were concerned that patient had delirium in afternoon and pt was evaluated by Dr Peggyann Juba'Sullivan but at that time patient had no one at bedside. She kept saying to him to call her parents even though they were deceased. When I relayed to the son , son said that it was normal for her to say that.  We discussed with Dr. Daiva EvesVan Dam who was ok with patient being discharged to ALF. DIscussed with Dr. Vincente LibertyMolt who did the medication reconciliation. Beta blockers and statins will be stopped at discharge.   Signed Deneise LeverParth Makeshia Seat PGY-2 IMTS

## 2016-08-27 NOTE — Progress Notes (Signed)
At 2030, patient and family members given discharge instruction/education and paperwork. Patient and family both verbalizes understanding of all discharge instructions. Patient with no c/o pain or sob at this time. Patient left forearm PIV removed; site soft, no bleeding or signs of infection. Site dressed with 2 x 2 gauze and paper tape. Patent to be discharged to SNF. Taken down to family vehicle via wheelchair to family vehicle.

## 2016-08-27 NOTE — Progress Notes (Addendum)
Clinical Social Worker was contacted by [facility] stating that patient is an observation patient and MD is ready to discharge patent back to her independent living. [facility] [phone]   CSW spoke with Darel HongJudy (RN) and she stated patient is welcome to come back to facility. CSW reach out to patients family and son and son-in-law will be transporting patient back to facility.  Marrianne MoodAshley Kaylin Marcon, MSW,  [employer] [phone]

## 2016-08-27 NOTE — Care Management Obs Status (Signed)
MEDICARE OBSERVATION STATUS NOTIFICATION   Patient Details  Name: Joanna Gay MRN: [id number] Date of Birth: [birth year]   Medicare Observation Status Notification Given:  Yes (letter left @ bedside, pt confused . CM spoke with son via phone and explained)    Epifanio LeschesCole, Lemon Sternberg Hudson, RN 08/27/2016, 11:32 AM

## 2016-08-27 NOTE — Discharge Summary (Signed)
Name: Joanna Gay MRN: [id number] DOB: [birth year] 83 y.o. PCP: Joanna Gunning, FNP  Date of Admission: 08/26/2016  1:25 PM Date of Discharge: 08/27/2016 Attending Physician: Joanna Hiss, MD  Discharge Diagnosis: 1. Dehydration and Lethargy 2. Bradycardia 3. Hypertension  Principal Problem:   Lethargy Active Problems:   Essential hypertension   Pressure injury of skin   Altered mental status   Delirium   Late onset Alzheimer's disease with behavioral disturbance   Alcohol use   Discharge Medications: Allergies as of 08/27/2016   No Known Allergies     Medication List    STOP taking these medications   carvedilol 6.25 MG tablet Commonly known as:  COREG   rosuvastatin 10 MG tablet Commonly known as:  CRESTOR   rosuvastatin 20 MG tablet Commonly known as:  CRESTOR     TAKE these medications   aspirin EC 81 MG tablet Take 1 tablet (81 mg total) by mouth daily.   citalopram 10 MG tablet Commonly known as:  CELEXA Take 10 mg by mouth daily.   CRANBERRY PO Take 1 capsule by mouth at bedtime.   enalapril 20 MG tablet Commonly known as:  VASOTEC TAKE 1 TABLET BY MOUTH TWICE DAILY   furosemide 20 MG tablet Commonly known as:  LASIX TAKE 1 TABLET BY MOUTH EVERY MORNING       Disposition and follow-up:   Ms.Joanna Gay was discharged from [facility] in  stable condition.  At the hospital follow up visit please address:  1.  Lethargy: Ensure Crestor has been discontinued and in addition please ensure she is not on any medications known to cause fatigue in the elderly. She seemed to respond well to fluid replacement and would encourage increased PO intake in this frail 83 year old. If she were to become lethargic again, would recommend UA, chest x-ray and CBC for evaluation. If normal, would try gentle IV rehydration.  Bradycardia: She was bradycardic in the 40-60's on admission with improvement after holding her  Coreg. She not experience reflex tachycardia. This medication ws held on discharge.  Hypertension: Her BP medications were held on admission however Enalapril was resumed as patient became hypertensive. Please ensure coreg has been discontinued or is restarted at a lower dose if required.   2.  Labs / imaging needed at time of follow-up: none  3.  Pending labs/ test needing follow-up: none  Follow-up Appointments: please follow up with your primary care within 1-2 days of discharge for close follow-up.   Hospital Course by problem list: Principal Problem:   Lethargy Active Problems:   Essential hypertension   Pressure injury of skin   Altered mental status   Delirium   Late onset Alzheimer's disease with behavioral disturbance   Alcohol use   1. Dehydration Ms. Joanna Gay is a 82 year old female admitted from ALF with a 2 day history of worsening lethargy. Pts daughter reported that the patient was a little confused Sunday evening however was lethargic and not interactive over the past 2 days. Daughter notes that the patient has had frequent UTIs over the past year leading to an overall decline in baseline mental status and functional status however maintains that the patient is quite interactive and intelligent at baseline. Does report decreased PO intake recently. UA was negative for infection, CXR was without evidence for consolidation or pneumonia, she was afebrile, not tachycardic, did not have leukocytosis nor was she anemic. Medications associated with fatigue were  discontinued during hospitalization, namely her Crestor. She was gently rehydrated with IVF and subsequently returned to baseline by the following morning. Ms. Joanna Gay experienced some confusion during hospitalization however both daughter and son in law reports she had returned to baseline and request discharge back to facility.   2. Bradycardia On presentation, MS. Joanna Gay was bradycardic which was new for the  patient per chart review. Her coreg was held on admission with improvement of her HR. This medication was discontinued upon discharge due to concerns of bradycardia and worsening fatigue.   Discharge Vitals:   BP (!) 161/65 (BP Location: Right Arm)   Pulse 61   Temp 98.1 F (36.7 C) (Oral)   Resp 18   Ht 5\' 3"  (1.6 m)   Wt 137 lb (62.1 kg)   SpO2 96%   BMI 24.27 kg/m   Pertinent Labs, Studies, and Procedures:  Urinalysis: Negative for WBC, leukocytes, bacteria, blood or nitrates CXR: Mild left base atelectasis. No area of consolidation or infiltrate appreciated. No pulmonary edema CBC: Without leukocytosis or anemia CMET: Without metabolic derangement to suggest renal or liver disease.   Discharge Instructions: Please discontinue your Coreg and your Crestor upon discharge. Your heart rate was slow on presentation but this improved after holding your Coreg. Crestor can cause fatigue in older patients and is not thought to provide a mortality benefit in this patient. Please drink fluids and keep yourself hydrated. Please contact your provider should your fatigue return for evaluation of volume status or infection. You did not have evidence for infection during admission.   SignedNoemi Gay: Joanna Kreuser, DO 08/27/2016, 7:58 PM   Pager: [phone]

## 2016-08-27 NOTE — ED Provider Notes (Signed)
Blood pressure 105/82, pulse 63, temperature 98.5 F (36.9 C), resp. rate 18, height 5\' 3"  (1.6 m), weight 137 lb (62.1 kg), SpO2 100 %.  Assuming care from Dr. Karma GanjaLinker.  In short, Joanna Gay is a 83 y.o. female with a chief complaint of Altered Mental Status .  Refer to the original H&P for additional details.  The current plan of care is to follow CT and reassess.  CT head with no acute findings to explain symptoms. Reviewed labs, notes, imaging, and EKG. I performed an independent re-assessment of the patient and discussed case with family at bedside. Plan for admission for acute encephalopathy.    EKG Interpretation  Date/Time:  Tuesday August 26 2016 15:39:53 EDT Ventricular Rate:  48 PR Interval:    QRS Duration: 92 QT Interval:  486 QTC Calculation: 435 R Axis:   -19 Text Interpretation:  Sinus bradycardia LVH by voltage Borderline T abnormalities, anterior leads No STEMI.  Confirmed by Vasily Fedewa MD, Melany Wiesman [phone]) on 08/26/2016 3:42:51 PM       Discussed patient's case with IM teaching team. Patient and family (if present) updated with plan. Care transferred to IM teaching service.  I reviewed all nursing notes, vitals, pertinent old records, EKGs, labs, imaging (as available).  Joanna BeneJoshua Vaiden Adames, MD   Maia PlanJoshua G Osei Anger, MD 08/27/16 [id number]

## 2016-08-27 NOTE — Progress Notes (Addendum)
Paged that patient was requesting to be discharged.  Evaluated at bedside  [name] says that she is 83 years old and doesn't want to spend her time here because she feels well and is in good health.  She can come back on Friday if that's what they want but doesn't need to wait until then.  She requests that her parents, who died in the 2790s, be called to be updated.  [place] [facility] [place] [facility] street.  Her daughter was visiting earlier in the day but is no longer at bedside.  Her bedside nurse assumed care this afternoon and does not know her baseline mental status.  Physical Exam  Constitutional:  Elderly woman lying in bed in no distress Calm, pleasant, conversant  Cardiovascular: Normal rate and regular rhythm.   Pulmonary/Chest: Effort normal and breath sounds normal.  Neurological:  Alert, calm, conversational, normal level of consciousness Knows her name, the year, that she is in [place], and that she is in a hospital Does not not which hospital or city she is in, or why she is in the hospital  Psychiatric:  Speech tangential with confabulation Judgement impaired   A/P Impaired cognition and judgement, not competent to make decision about her discharge.  Uncertain baseline mental status, may represent delerium vs dementia. -Observe overnight, reassess in morning -Call daughter Joanna Gay -Continue current supportive care

## 2016-08-31 LAB — CULTURE, BLOOD (ROUTINE X 2)
CULTURE: NO GROWTH
Culture: NO GROWTH

## 2016-09-05 ENCOUNTER — Emergency Department (HOSPITAL_COMMUNITY)
Admission: EM | Admit: 2016-09-05 | Discharge: 2016-09-05 | Disposition: A | Discharge status: Home / Self Care | Payer: Medicare Other | Attending: Emergency Medicine | Admitting: Emergency Medicine

## 2016-09-05 ENCOUNTER — Emergency Department (HOSPITAL_COMMUNITY): Payer: Medicare Other

## 2016-09-05 DIAGNOSIS — F0280 Dementia in other diseases classified elsewhere without behavioral disturbance: Secondary | ICD-10-CM | POA: Insufficient documentation

## 2016-09-05 DIAGNOSIS — Z87891 Personal history of nicotine dependence: Secondary | ICD-10-CM | POA: Diagnosis not present

## 2016-09-05 DIAGNOSIS — G309 Alzheimer's disease, unspecified: Secondary | ICD-10-CM | POA: Diagnosis not present

## 2016-09-05 DIAGNOSIS — I110 Hypertensive heart disease with heart failure: Secondary | ICD-10-CM | POA: Insufficient documentation

## 2016-09-05 DIAGNOSIS — I252 Old myocardial infarction: Secondary | ICD-10-CM | POA: Insufficient documentation

## 2016-09-05 DIAGNOSIS — Y92129 Unspecified place in nursing home as the place of occurrence of the external cause: Secondary | ICD-10-CM | POA: Diagnosis not present

## 2016-09-05 DIAGNOSIS — I509 Heart failure, unspecified: Secondary | ICD-10-CM | POA: Insufficient documentation

## 2016-09-05 DIAGNOSIS — Y939 Activity, unspecified: Secondary | ICD-10-CM | POA: Diagnosis not present

## 2016-09-05 DIAGNOSIS — Y999 Unspecified external cause status: Secondary | ICD-10-CM | POA: Insufficient documentation

## 2016-09-05 DIAGNOSIS — I2510 Atherosclerotic heart disease of native coronary artery without angina pectoris: Secondary | ICD-10-CM | POA: Insufficient documentation

## 2016-09-05 DIAGNOSIS — Z7982 Long term (current) use of aspirin: Secondary | ICD-10-CM | POA: Insufficient documentation

## 2016-09-05 DIAGNOSIS — Z79899 Other long term (current) drug therapy: Secondary | ICD-10-CM | POA: Diagnosis not present

## 2016-09-05 DIAGNOSIS — S299XXA Unspecified injury of thorax, initial encounter: Secondary | ICD-10-CM | POA: Diagnosis present

## 2016-09-05 DIAGNOSIS — W19XXXA Unspecified fall, initial encounter: Secondary | ICD-10-CM | POA: Diagnosis not present

## 2016-09-05 LAB — BASIC METABOLIC PANEL
Anion gap: 9 (ref 5–15)
BUN: 25 mg/dL — ABNORMAL HIGH (ref 6–20)
CHLORIDE: 104 mmol/L (ref 101–111)
CO2: 24 mmol/L (ref 22–32)
Calcium: 9 mg/dL (ref 8.9–10.3)
Creatinine, Ser: 1.33 mg/dL — ABNORMAL HIGH (ref 0.44–1.00)
GFR calc Af Amer: 39 mL/min — ABNORMAL LOW (ref >60)
GFR calc non Af Amer: 34 mL/min — ABNORMAL LOW (ref >60)
Glucose, Bld: 113 mg/dL — ABNORMAL HIGH (ref 65–99)
POTASSIUM: 4.2 mmol/L (ref 3.5–5.1)
SODIUM: 137 mmol/L (ref 135–145)

## 2016-09-05 LAB — URINALYSIS, ROUTINE W REFLEX MICROSCOPIC
Bilirubin Urine: NEGATIVE
Glucose, UA: NEGATIVE mg/dL
Hgb urine dipstick: NEGATIVE
KETONES UR: NEGATIVE mg/dL
LEUKOCYTES UA: NEGATIVE
NITRITE: NEGATIVE
PH: 5 (ref 5.0–8.0)
PROTEIN: NEGATIVE mg/dL
Specific Gravity, Urine: 1.021 (ref 1.005–1.030)

## 2016-09-05 LAB — CBC
HCT: 39.5 % (ref 36.0–46.0)
HEMOGLOBIN: 12.8 g/dL (ref 12.0–15.0)
MCH: 29.2 pg (ref 26.0–34.0)
MCHC: 32.4 g/dL (ref 30.0–36.0)
MCV: 90.2 fL (ref 78.0–100.0)
Platelets: 240 10*3/uL (ref 150–400)
RBC: 4.38 MIL/uL (ref 3.87–5.11)
RDW: 15.9 % — ABNORMAL HIGH (ref 11.5–15.5)
WBC: 8.8 10*3/uL (ref 4.0–10.5)

## 2016-09-05 NOTE — ED Provider Notes (Signed)
[facility]-EMERGENCY DEPT Provider Note   CSN: [id number] Arrival date & time: 09/05/16  1538     History   Chief Complaint Chief Complaint  Patient presents with  . Fall    HPI Ashyah Quizon Crandell is a 83 y.o. female.  HPI Level V caveat second to some history of dementia and patient hard of hearing Is a 83 year old female with a history of Alzheimer's disease who presents today from assisted living care facility. They report the patient was left on the couch and several hours later they found her on the floor. She is unable to give me further history. She denies pain. Past Medical History:  Diagnosis Date  . CHF (congestive heart failure) (HCC)   . Coronary artery disease    late presentation anterior MI in 5/11. LHC showed 95% proximal LAD stenosis.  Patient had a PROMUS DES to the LAD.  Marland Kitchen History of tobacco use   . Hyperlipidemia   . Hypertension   . Ischemic cardiomyopathy    Echo (5/11) showed EF 30% with anterior, anteroseptal, inferoseptal, and apex akinesis. There was restrictive diastolic function, moderate mitral regurgitation, and PA systolic pressure 59 mmHg. Repeat echo (9/11) with EF 60%, mild LVH, no regional wall motion abnormalities, PA systolic pressure 32 mmHg, mild MR.   . Moderate mitral regurgitation by prior echocardiogram    moderate mitral regurgitation by echo 5/11 but only mild on repeat echo in 9/11.   . Myocardial infarction   . Osteoarthritis   . S/P TAH-BSO     Patient Active Problem List   Diagnosis Date Noted  . Pressure injury of skin 08/27/2016  . Altered mental status   . Delirium   . Late onset Alzheimer's disease with behavioral disturbance   . Alcohol use   . Lethargy 08/26/2016  . Onychomycosis due to dermatophyte 09/21/2014  . HYPERLIPIDEMIA-MIXED 11/29/2009  . CAD, NATIVE VESSEL 11/29/2009  . MITRAL REGURGITATION, MODERATE 11/28/2009  . Essential hypertension 11/28/2009  . MYOCARDIAL INFARCTION, ACUTE, SUBENDOCARDIAL  11/28/2009  . ISCHEMIC CARDIOMYOPATHY 11/28/2009  . OSTEOARTHRITIS 11/28/2009  . TOBACCO USE, QUIT 11/28/2009    Past Surgical History:  Procedure Laterality Date  . CAROTID STENT INSERTION    . TOTAL ABDOMINAL HYSTERECTOMY W/ BILATERAL SALPINGOOPHORECTOMY      OB History    No data available       Home Medications    Prior to Admission medications   Medication Sig Start Date End Date Taking? Authorizing Provider  aspirin EC 81 MG tablet Take 1 tablet (81 mg total) by mouth daily. 10/18/10  Yes Laurey Morale, MD  citalopram (CELEXA) 10 MG tablet Take 10 mg by mouth daily.   Yes Historical Provider, MD  enalapril (VASOTEC) 20 MG tablet TAKE 1 TABLET BY MOUTH TWICE DAILY 05/20/16  Yes Beatrice Lecher, PA-C  furosemide (LASIX) 20 MG tablet TAKE 1 TABLET BY MOUTH EVERY MORNING 04/08/16  Yes Beatrice Lecher, PA-C    Family History Family History  Problem Relation Age of Onset  . Heart attack Brother   . Stroke Sister     Social History Social History  Substance Use Topics  . Smoking status: Former Smoker    Types: Cigarettes    Quit date: 10/17/1990  . Smokeless tobacco: Never Used  . Alcohol use No     Allergies   Patient has no known allergies.   Review of Systems Review of Systems  Unable to perform ROS: Dementia     Physical Exam Updated  Vital Signs BP 138/69   Pulse 79   Temp 97.9 F (36.6 C) (Rectal)   Resp 14   SpO2 98%   Physical Exam  Constitutional: She appears well-developed and well-nourished. No distress.  HENT:  Head: Normocephalic and atraumatic.  Right Ear: External ear normal.  Left Ear: External ear normal.  Nose: Nose normal.  Eyes: Conjunctivae and EOM are normal. Pupils are equal, round, and reactive to light.  Neck: Normal range of motion. Neck supple.  Cardiovascular: Normal rate, regular rhythm, normal heart sounds and intact distal pulses.   Pulmonary/Chest: Effort normal.  Abdominal: Soft. Bowel sounds are normal.    Musculoskeletal: Normal range of motion. She exhibits edema.  No external signs of trauma patient with cervical collar in place no tenderness of her remaining spine. Will active range of motion of all extremities with no obvious deformities or tenderness to palpation Bilateral lower extremity swelling with changes of chronic venous insufficiency.  Neurological: She is alert. She displays normal reflexes. No cranial nerve deficit. Coordination normal.  Patient alert and able to [facility] her name but not oriented to place although it is difficult to tell as she is extremely hard of hearing  Skin: Skin is warm and dry. Capillary refill takes less than 2 seconds.  Psychiatric: She has a normal mood and affect.  Nursing note and vitals reviewed.    ED Treatments / Results  Labs (all labs ordered are listed, but only abnormal results are displayed) Labs Reviewed  URINALYSIS, ROUTINE W REFLEX MICROSCOPIC - Abnormal; Notable for the following:       Result Value   APPearance HAZY (*)    All other components within normal limits  CBC - Abnormal; Notable for the following:    RDW 15.9 (*)    All other components within normal limits  BASIC METABOLIC PANEL - Abnormal; Notable for the following:    Glucose, Bld 113 (*)    BUN 25 (*)    Creatinine, Ser 1.33 (*)    GFR calc non Af Amer 34 (*)    GFR calc Af Amer 39 (*)    All other components within normal limits   Radiology Dg Ribs Unilateral W/chest Right  Result Date: 09/05/2016 CLINICAL DATA:  fall right back injury. Unable to get hx from pt. She seemed confused and was unable to answer questions. Hx of CHF, CAD, HTN, Ischemic cardiomyopathy, moderate mitral valve regurgitation, MI EXAM: RIGHT RIBS AND CHEST - 3+ VIEW COMPARISON:  08/26/2016 FINDINGS: No rib fracture or rib lesion. The skeletal structures are diffusely demineralized. Lungs appear clear. No convincing pleural effusion or gross pneumothorax on the supine images. IMPRESSION: 1. No  rib fracture or rib lesion.  No acute finding. Electronically Signed   By: Amie Portland M.D.   On: 09/05/2016 16:45   Ct Head Wo Contrast  Result Date: 09/05/2016 CLINICAL DATA:  Unwitnessed fall, dimension. EXAM: CT HEAD WITHOUT CONTRAST CT CERVICAL SPINE WITHOUT CONTRAST TECHNIQUE: Multidetector CT imaging of the head and cervical spine was performed following the standard protocol without intravenous contrast. Multiplanar CT image reconstructions of the cervical spine were also generated. COMPARISON:  08/26/2016 CT head FINDINGS: CT HEAD FINDINGS BRAIN: There is sulcal and ventricular prominence consistent with superficial and central atrophy. No intraparenchymal hemorrhage, mass effect nor midline shift. Periventricular and subcortical white matter hypodensities consistent with chronic small vessel ischemic disease are identified. No acute large vascular territory infarcts. No abnormal extra-axial fluid collections. Basal cisterns are not effaced and  midline. VASCULAR: Moderate calcific atherosclerosis of the carotid siphons and visualized distal vertebral arteries. SKULL: No skull fracture. No significant scalp soft tissue swelling. SINUSES/ORBITS: The mastoid air-cells are clear. The included paranasal sinuses are well-aerated.The included ocular globes and orbital contents are non-suspicious. OTHER: None. CT CERVICAL SPINE FINDINGS Alignment: Normal craniocervical relationship. Osteoarthritis of the atlantodental interval. Reversal cervical lordosis attributable to degenerative disc disease with apex at C4-5. Skull base and vertebrae: No acute fracture. No primary bone lesion or focal pathologic process. Remodeled appearance of the left lateral mass of C2 secondary to osteoarthritis. Soft tissues and spinal canal: No prevertebral fluid or swelling. No visible canal hematoma. Disc levels: Degenerative disc disease of the cervical spine most pronounced at C5-6 and C6-7. Upper thoracic degenerative disc  disease of the included thoracic spine is also noted from T1 through T3. This ankylosis of the facets bilaterally at C3-4 with multilevel degenerative facet arthropathy characterized by joint space narrowing and bony hypertrophy. These contribute to bilateral neural foraminal encroachment. Component of mild osseous Send to canal stenosis at C5-6 secondary to degenerative disc disease and osteophyte formation. Upper chest: Biapical pleuroparenchymal scarring. Other: None IMPRESSION: Chronic cerebral atrophy and small vessel ischemic disease of periventricular white matter. No acute intracranial abnormality. No acute posttraumatic cervical spine fracture or subluxation. Cervical spondylosis as above. Electronically Signed   By: Tollie Eth M.D.   On: 09/05/2016 17:38   Ct Cervical Spine Wo Contrast  Result Date: 09/05/2016 CLINICAL DATA:  Unwitnessed fall, dimension. EXAM: CT HEAD WITHOUT CONTRAST CT CERVICAL SPINE WITHOUT CONTRAST TECHNIQUE: Multidetector CT imaging of the head and cervical spine was performed following the standard protocol without intravenous contrast. Multiplanar CT image reconstructions of the cervical spine were also generated. COMPARISON:  08/26/2016 CT head FINDINGS: CT HEAD FINDINGS BRAIN: There is sulcal and ventricular prominence consistent with superficial and central atrophy. No intraparenchymal hemorrhage, mass effect nor midline shift. Periventricular and subcortical white matter hypodensities consistent with chronic small vessel ischemic disease are identified. No acute large vascular territory infarcts. No abnormal extra-axial fluid collections. Basal cisterns are not effaced and midline. VASCULAR: Moderate calcific atherosclerosis of the carotid siphons and visualized distal vertebral arteries. SKULL: No skull fracture. No significant scalp soft tissue swelling. SINUSES/ORBITS: The mastoid air-cells are clear. The included paranasal sinuses are well-aerated.The included ocular  globes and orbital contents are non-suspicious. OTHER: None. CT CERVICAL SPINE FINDINGS Alignment: Normal craniocervical relationship. Osteoarthritis of the atlantodental interval. Reversal cervical lordosis attributable to degenerative disc disease with apex at C4-5. Skull base and vertebrae: No acute fracture. No primary bone lesion or focal pathologic process. Remodeled appearance of the left lateral mass of C2 secondary to osteoarthritis. Soft tissues and spinal canal: No prevertebral fluid or swelling. No visible canal hematoma. Disc levels: Degenerative disc disease of the cervical spine most pronounced at C5-6 and C6-7. Upper thoracic degenerative disc disease of the included thoracic spine is also noted from T1 through T3. This ankylosis of the facets bilaterally at C3-4 with multilevel degenerative facet arthropathy characterized by joint space narrowing and bony hypertrophy. These contribute to bilateral neural foraminal encroachment. Component of mild osseous Send to canal stenosis at C5-6 secondary to degenerative disc disease and osteophyte formation. Upper chest: Biapical pleuroparenchymal scarring. Other: None IMPRESSION: Chronic cerebral atrophy and small vessel ischemic disease of periventricular white matter. No acute intracranial abnormality. No acute posttraumatic cervical spine fracture or subluxation. Cervical spondylosis as above. Electronically Signed   By: Tollie Eth M.D.   On: 09/05/2016  17:38    Procedures Procedures (including critical care time)  Medications Ordered in ED Medications - No data to display   Initial Impression / Assessment and Plan / ED Course  I have reviewed the triage vital signs and the nursing notes.  Pertinent labs & imaging results that were available during my care of the patient were reviewed by me and considered in my medical decision making (see chart for details).   is a 83 year old female from [facility] assisted living care facility. She was  found on the ground. I discussed the patient's care with her son and daughter-in-law. They state that this has happened recurrently. They stated that they would like to find a way to have the assisted living care facility not send her every time. We discussed significant injuries and they state that they would not want any interventions done regardless as per her wishes. She does not have a primary care doctor. We discussed referral to palliative care and they would like a referral.  Final Clinical Impressions(s) / ED Diagnoses   Final diagnoses:  Fall, initial encounter    New Prescriptions New Prescriptions   No medications on file     Margarita Grizzle, MD 09/05/16 [phone]

## 2016-09-05 NOTE — ED Notes (Signed)
ED Provider at bedside. 

## 2016-09-05 NOTE — ED Triage Notes (Signed)
Per [facility]: Pt to ED from [facility] following unwitnessed fall - last seen by nursing staff at 1300 after they took her to the restroom and got her back on the couch - seen again at 1500 on the floor, naked, unknown LOC. Pitting edema noted to BLE - normal for patient. Hx dementia - at baseline, A&O x 2. EMS VS: 130/80, P 50 (normal per staff), RR 18, 96% RA, CBG 132. Pt not on blood thinners, pt denies pain. No visible signs of injury other than old bruising to lower extremities.

## 2016-09-05 NOTE — ED Notes (Signed)
This RN assisted Leonette Most EMT/Tech with In and Out catheter and rectal temp.

## 2016-09-05 NOTE — ED Notes (Signed)
Patient transported to CT 

## 2016-12-28 ENCOUNTER — Other Ambulatory Visit: Payer: Self-pay | Admitting: Physician Assistant

## 2017-01-25 ENCOUNTER — Emergency Department (HOSPITAL_COMMUNITY)
Admission: EM | Admit: 2017-01-25 | Discharge: 2017-01-25 | Disposition: A | Discharge status: Home / Self Care | Attending: Emergency Medicine | Admitting: Emergency Medicine

## 2017-01-25 ENCOUNTER — Encounter (HOSPITAL_COMMUNITY): Payer: Self-pay

## 2017-01-25 DIAGNOSIS — I2510 Atherosclerotic heart disease of native coronary artery without angina pectoris: Secondary | ICD-10-CM | POA: Insufficient documentation

## 2017-01-25 DIAGNOSIS — I509 Heart failure, unspecified: Secondary | ICD-10-CM | POA: Insufficient documentation

## 2017-01-25 DIAGNOSIS — S79922A Unspecified injury of left thigh, initial encounter: Secondary | ICD-10-CM | POA: Diagnosis present

## 2017-01-25 DIAGNOSIS — I110 Hypertensive heart disease with heart failure: Secondary | ICD-10-CM | POA: Diagnosis not present

## 2017-01-25 DIAGNOSIS — Z7982 Long term (current) use of aspirin: Secondary | ICD-10-CM | POA: Diagnosis not present

## 2017-01-25 DIAGNOSIS — Y939 Activity, unspecified: Secondary | ICD-10-CM | POA: Diagnosis not present

## 2017-01-25 DIAGNOSIS — G309 Alzheimer's disease, unspecified: Secondary | ICD-10-CM | POA: Diagnosis not present

## 2017-01-25 DIAGNOSIS — X58XXXA Exposure to other specified factors, initial encounter: Secondary | ICD-10-CM | POA: Insufficient documentation

## 2017-01-25 DIAGNOSIS — Y929 Unspecified place or not applicable: Secondary | ICD-10-CM | POA: Diagnosis not present

## 2017-01-25 DIAGNOSIS — S70322A Blister (nonthermal), left thigh, initial encounter: Secondary | ICD-10-CM | POA: Insufficient documentation

## 2017-01-25 DIAGNOSIS — Z79899 Other long term (current) drug therapy: Secondary | ICD-10-CM | POA: Diagnosis not present

## 2017-01-25 DIAGNOSIS — L03115 Cellulitis of right lower limb: Secondary | ICD-10-CM | POA: Diagnosis not present

## 2017-01-25 DIAGNOSIS — Z87891 Personal history of nicotine dependence: Secondary | ICD-10-CM | POA: Diagnosis not present

## 2017-01-25 DIAGNOSIS — Y999 Unspecified external cause status: Secondary | ICD-10-CM | POA: Insufficient documentation

## 2017-01-25 DIAGNOSIS — T148XXA Other injury of unspecified body region, initial encounter: Secondary | ICD-10-CM

## 2017-01-25 MED ORDER — CEPHALEXIN 500 MG PO CAPS: 500.0000 mg | ORAL_CAPSULE | Freq: Four times a day (QID) | ORAL | 0 refills | Status: AC

## 2017-01-25 MED ORDER — HYDROCODONE-ACETAMINOPHEN 5-325 MG PO TABS
1.0000 | ORAL_TABLET | Freq: Once | ORAL | Status: AC
  Administered 2017-01-25: 1 via ORAL
  Filled 2017-01-25: qty 1

## 2017-01-25 NOTE — Discharge Instructions (Signed)
Please keep wound clean and dry. Change dressing at least once daily and more if it soaks through dressing applied Take Keflex 4 times daily for the next week Give Hydrocodone as needed for pain Return for worsening symptoms

## 2017-01-25 NOTE — ED Notes (Signed)
Pt legs very swollen and whipping. Left lower leg is very swollen with fluids and purple  area on the back of her leg.

## 2017-01-25 NOTE — ED Provider Notes (Signed)
[facility]-EMERGENCY DEPT Provider Note   CSN: [id number] Arrival date & time: 01/25/17  1848     History   Chief Complaint No chief complaint on file.   HPI Joanna Gay is a 83 y.o. female who presents with left leg redness with a blood blister. PMH significant for advanced dementia, CHF, CAD, HTN, HLD, arthritis. She is a hospice patient and daughter in law at bedside states family wants comfort care only. She states she visited the patient yesterday and did not notice her leg being red or that it had a blister. Today family was notified about this by nursing staff. They did not report any falls or injuries at the facility. Daughter in law states that the nursing facility also told her that she has not been eating or drinking today and has been sleeping for the most part which is unusual for her. The patient has chronic lower leg edema and is on Lasix.  LEVEL 5 CAVEAT due to dementia  HPI  Past Medical History:  Diagnosis Date  . CHF (congestive heart failure) (HCC)   . Coronary artery disease    late presentation anterior MI in 5/11. LHC showed 95% proximal LAD stenosis.  Patient had a PROMUS DES to the LAD.  Marland Kitchen History of tobacco use   . Hyperlipidemia   . Hypertension   . Ischemic cardiomyopathy    Echo (5/11) showed EF 30% with anterior, anteroseptal, inferoseptal, and apex akinesis. There was restrictive diastolic function, moderate mitral regurgitation, and PA systolic pressure 59 mmHg. Repeat echo (9/11) with EF 60%, mild LVH, no regional wall motion abnormalities, PA systolic pressure 32 mmHg, mild MR.   . Moderate mitral regurgitation by prior echocardiogram    moderate mitral regurgitation by echo 5/11 but only mild on repeat echo in 9/11.   . Myocardial infarction (HCC)   . Osteoarthritis   . S/P TAH-BSO     Patient Active Problem List   Diagnosis Date Noted  . Pressure injury of skin 08/27/2016  . Altered mental status   . Delirium   . Late onset  Alzheimer's disease with behavioral disturbance   . Alcohol use   . Lethargy 08/26/2016  . Onychomycosis due to dermatophyte 09/21/2014  . HYPERLIPIDEMIA-MIXED 11/29/2009  . CAD, NATIVE VESSEL 11/29/2009  . MITRAL REGURGITATION, MODERATE 11/28/2009  . Essential hypertension 11/28/2009  . MYOCARDIAL INFARCTION, ACUTE, SUBENDOCARDIAL 11/28/2009  . ISCHEMIC CARDIOMYOPATHY 11/28/2009  . OSTEOARTHRITIS 11/28/2009  . TOBACCO USE, QUIT 11/28/2009    Past Surgical History:  Procedure Laterality Date  . CAROTID STENT INSERTION    . TOTAL ABDOMINAL HYSTERECTOMY W/ BILATERAL SALPINGOOPHORECTOMY      OB History    No data available       Home Medications    Prior to Admission medications   Medication Sig Start Date End Date Taking? Authorizing Provider  aspirin EC 81 MG tablet Take 1 tablet (81 mg total) by mouth daily. 10/18/10   Laurey Morale, MD  citalopram (CELEXA) 10 MG tablet Take 10 mg by mouth daily.    [provider]  enalapril (VASOTEC) 20 MG tablet TAKE ONE TABLET TWICE DAILY 12/29/16   Tereso Newcomer T, PA-C  furosemide (LASIX) 20 MG tablet TAKE 1 TABLET BY MOUTH EVERY MORNING 04/08/16   Beatrice Lecher, PA-C    Family History Family History  Problem Relation Age of Onset  . Heart attack Brother   . Stroke Sister     Social History Social History  Substance Use  Topics  . Smoking status: Former Smoker    Types: Cigarettes    Quit date: 10/17/1990  . Smokeless tobacco: Never Used  . Alcohol use No     Allergies   Patient has no known allergies.   Review of Systems Review of Systems  Unable to perform ROS: Dementia     Physical Exam Updated Vital Signs BP (!) 166/78   Pulse 64   Temp 98.3 F (36.8 C) (Axillary)   Resp 15   SpO2 100%   Physical Exam  Constitutional: She appears well-developed and well-nourished. No distress.  Elderly in NAD unless touched  HENT:  Head: Normocephalic and atraumatic.  Eyes: Pupils are equal, round, and  reactive to light. Conjunctivae are normal. Right eye exhibits no discharge. Left eye exhibits no discharge. No scleral icterus.  Neck: Normal range of motion.  Cardiovascular: Normal rate and regular rhythm.  Exam reveals no gallop and no friction rub.   No murmur heard. Pulmonary/Chest: Effort normal and breath sounds normal. No respiratory distress. She has no wheezes. She has no rales. She exhibits no tenderness.  Abdominal: Soft. Bowel sounds are normal. She exhibits no distension. There is no tenderness.  Musculoskeletal:  Severe lower leg edema bilaterally. Left leg has redness and extending up to the knee. She cries in pain when this is touched. There is a 6x6 cm blood blister over the lateral calf which is fluctuant and tender.   Neurological: She is alert. She is disoriented. GCS eye subscore is 4. GCS verbal subscore is 5. GCS motor subscore is 5.  Skin: Skin is warm and dry.  Skin tear over proximal posterior calf  Psychiatric: She has a normal mood and affect. Her behavior is normal.  Nursing note reviewed.    ED Treatments / Results  Labs (all labs ordered are listed, but only abnormal results are displayed) Labs Reviewed - No data to display  EKG  EKG Interpretation None       Radiology No results found.  Procedures Procedures (including critical care time)  Medications Ordered in ED Medications  HYDROcodone-acetaminophen (NORCO/VICODIN) 5-325 MG per tablet 1 tablet (1 tablet Oral Given 01/25/17 2048)     Initial Impression / Assessment and Plan / ED Course  I have reviewed the triage vital signs and the nursing notes.  Pertinent labs & imaging results that were available during my care of the patient were reviewed by me and considered in my medical decision making (see chart for details).  83 year old female with cellulitis and blood blister on the left lower extremity. Daughter in law states that she does not want blood draws and wants a minimal work up.  She is afebrile, not tachycardic, and hypertensive so this is reasonable. I&D was performed for comfort. Patient tolerated this with difficulty. Dressing was applied to legs. Hydrocodone was given. Will give rx for Keflex and she can return for worsening symptoms  Final Clinical Impressions(s) / ED Diagnoses   Final diagnoses:  Blood blister  Cellulitis of right leg    New Prescriptions New Prescriptions   No medications on file     Beryle Quant 01/25/17 2120    Linwood Dibbles, MD 01/26/17 2318

## 2017-01-25 NOTE — ED Notes (Signed)
Bed: [id number] Expected date: 01/25/17 Expected time: 6:45 PM Means of arrival: Ambulance Comments: Dementia

## 2017-01-25 NOTE — ED Triage Notes (Signed)
Patient presents with complain of left lower leg hematoma with edema. Pt have bilateral edema baseline. Pt hx of dementia and at base line at the moment also hospice pt. Pt coming from nursing home and bed bound.

## 2017-01-25 NOTE — ED Notes (Signed)
[facility] contacted for discharge transport 

## 2017-10-07 DEATH — deceased

## 2018-03-09 IMAGING — DX DG CHEST 2V
2 series · 2 of 2 positions shown · non-contrast
Comparison: Chest radiograph November 03, 2009 and chest CT November 03, 2009

CLINICAL DATA: Altered mental status

EXAM:
CHEST  2 VIEW

[chest lat]
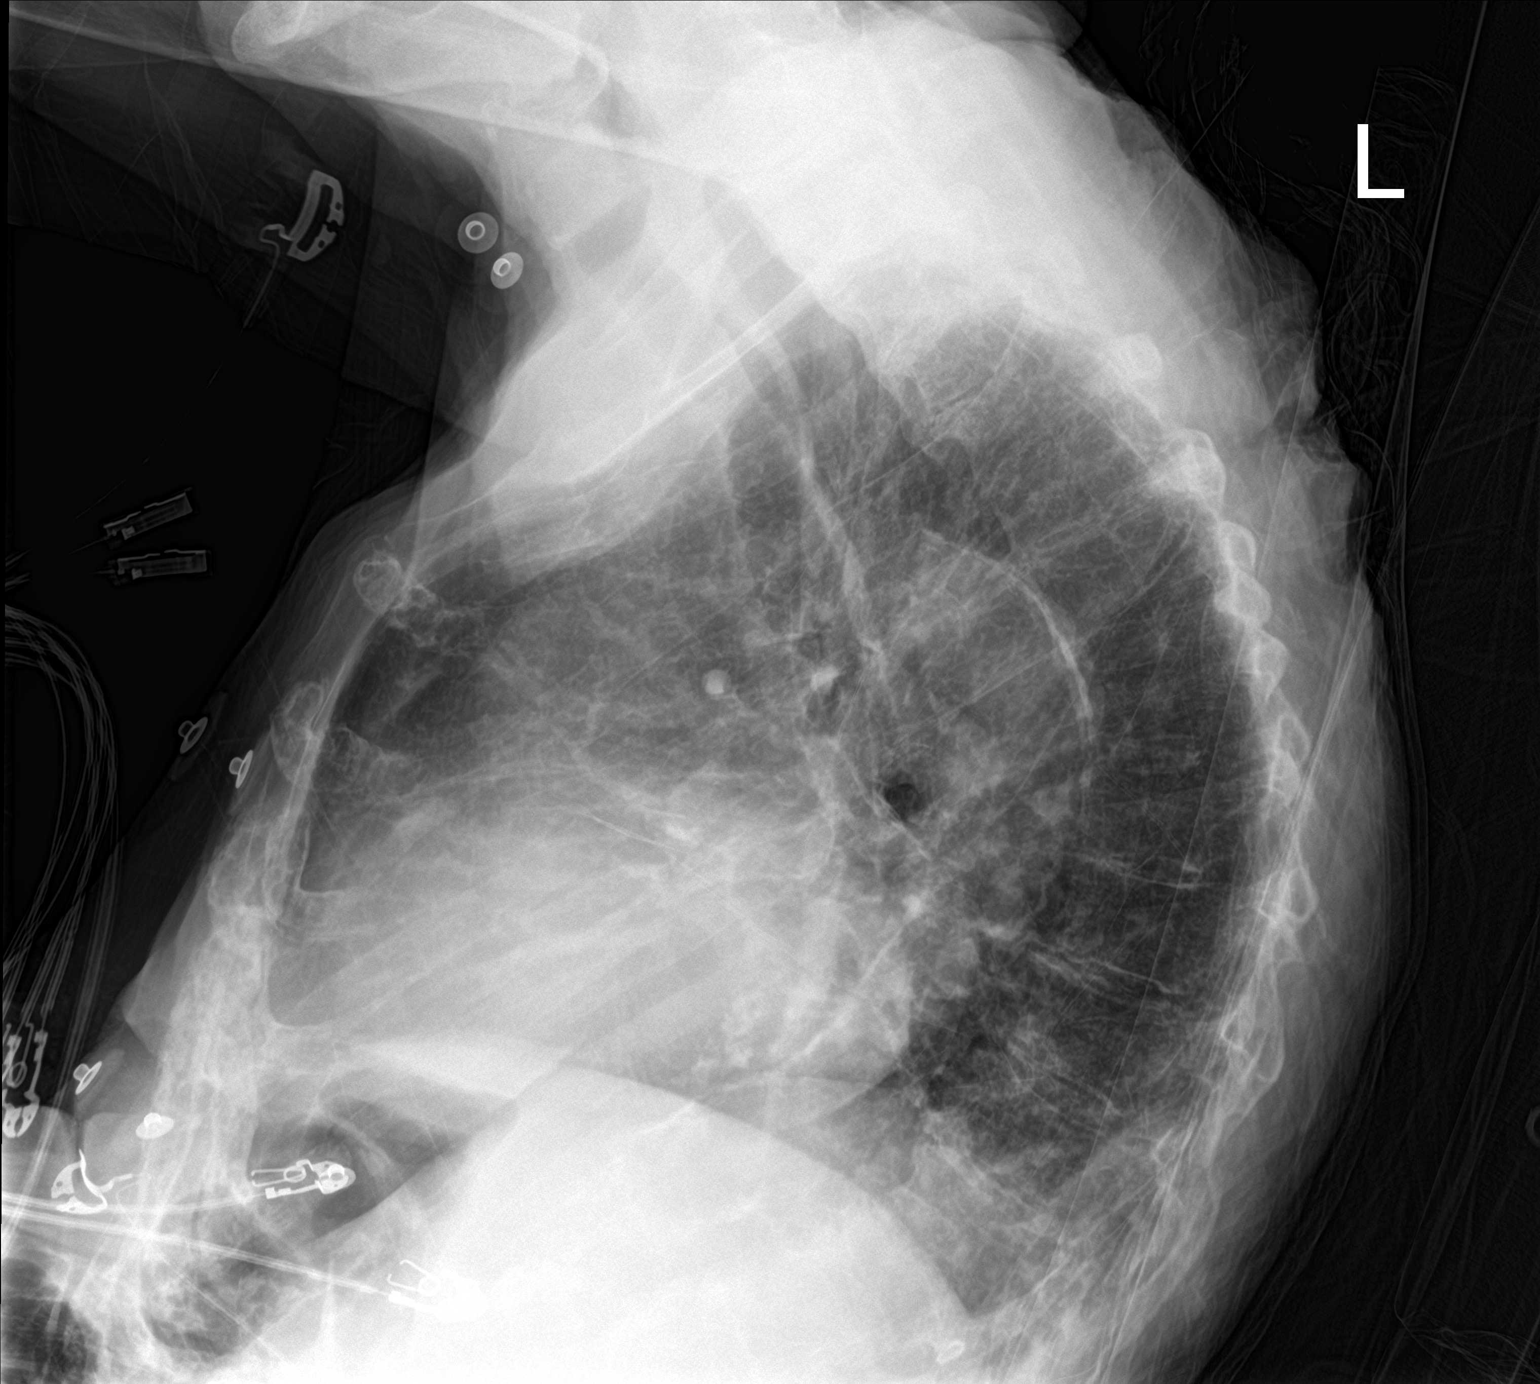

[chest ap]
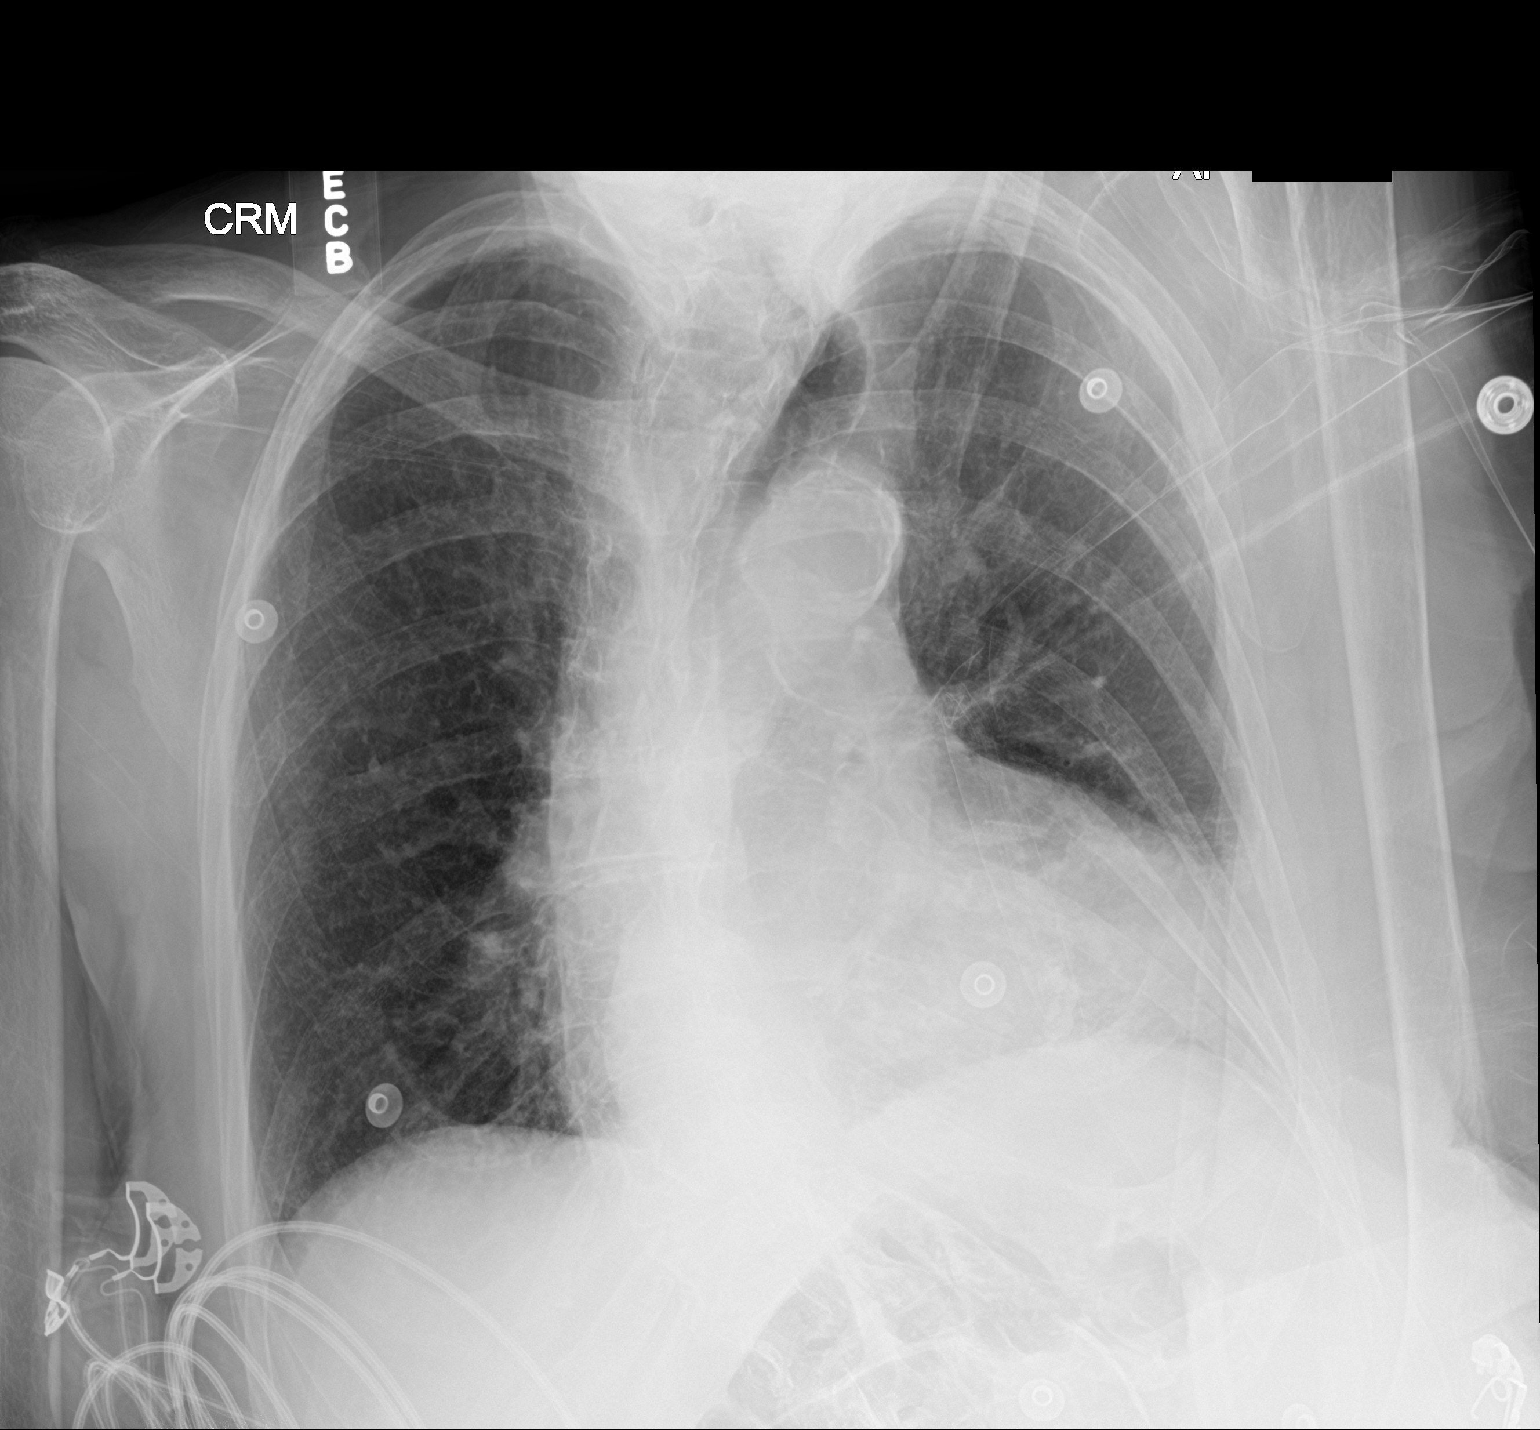

[2 of 2 positions shown; findings below may reference images not displayed]

FINDINGS: There is mild atelectasis in the left base. There is no edema or
consolidation. There is cardiomegaly with pulmonary vascularity
within normal limits. There is atherosclerotic calcification in the
aorta. No adenopathy. Bones are osteoporotic. There is degenerative
change in thoracic spine. There is thoracic dextroscoliosis.
IMPRESSION: Mild left base atelectasis. No edema or consolidation. Cardiomegaly.
Aortic atherosclerosis.

## 2018-03-09 IMAGING — CT CT HEAD W/O CM
3 of 4 series · 19 of 47 positions shown, 23 images · non-contrast
Comparison: 07/21/2016.

CLINICAL DATA: [AGE] hypertensive female with altered mental
status. Initial encounter.

EXAM:
CT HEAD WITHOUT CONTRAST
TECHNIQUE: Contiguous axial images were obtained from the base of the skull
through the vertex without intravenous contrast.

[Series 201: head w/o, idose (1) · axial · non-contrast · 0.42mm/px · z∈[+946,+1080]mm · 13 of 33 slices shown, 17 images]
[im 3/33  brain]
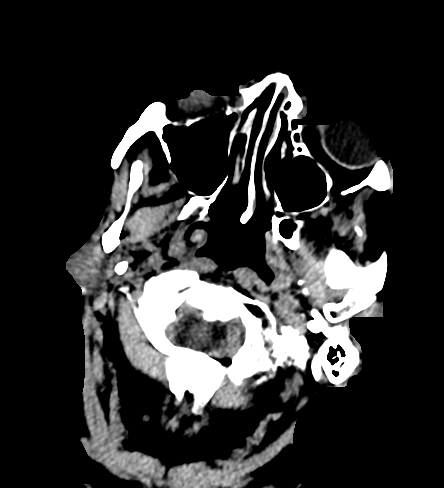
[im 3/33  bone]
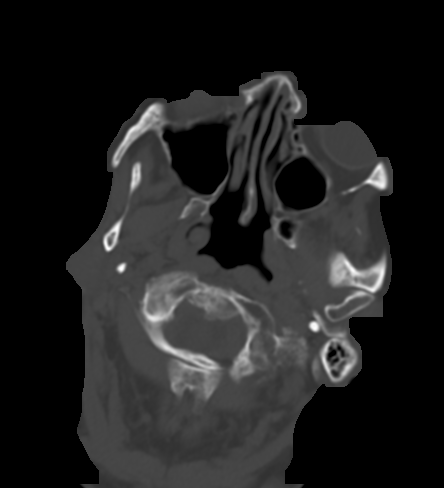
[im 5/33  brain]
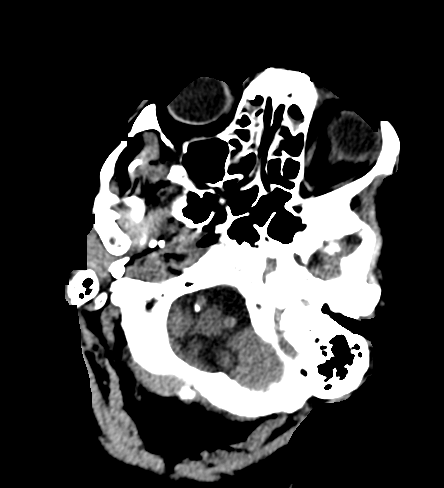
[im 7/33  brain]
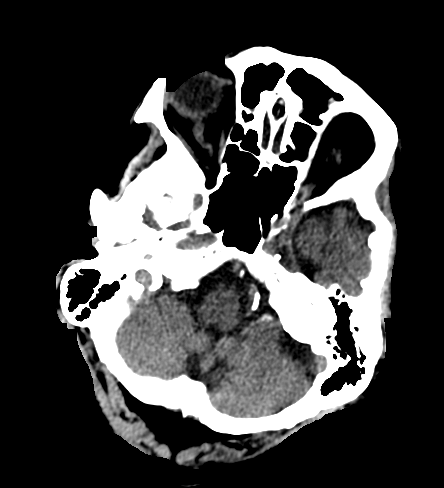
[im 10/33  brain]
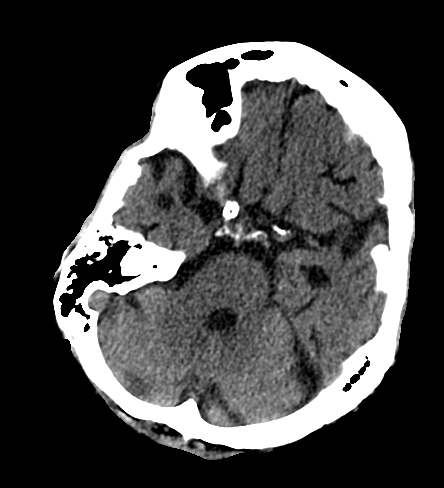
[im 12/33  brain]
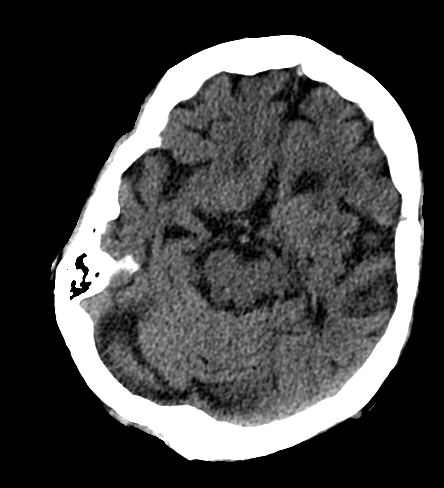
[im 12/33  bone]
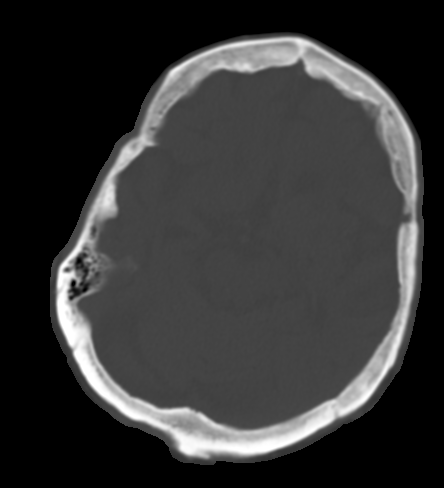
[im 14/33  brain]
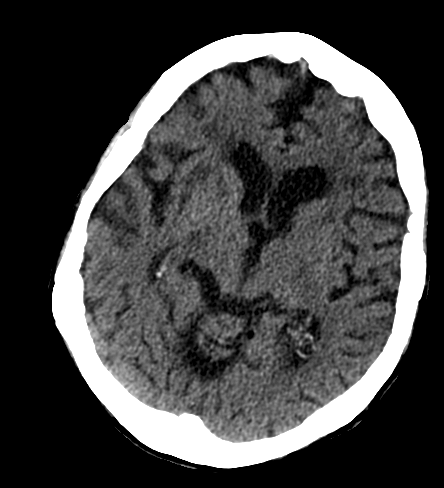
[im 17/33  brain]
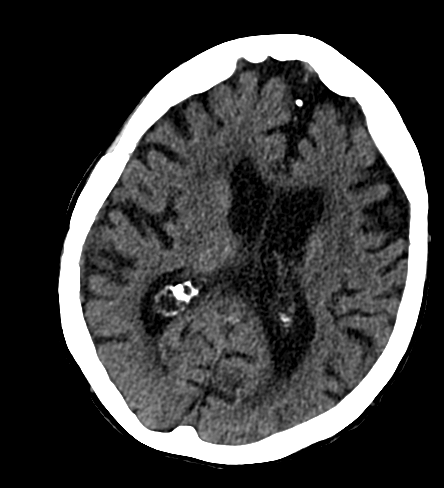
[im 19/33  brain]
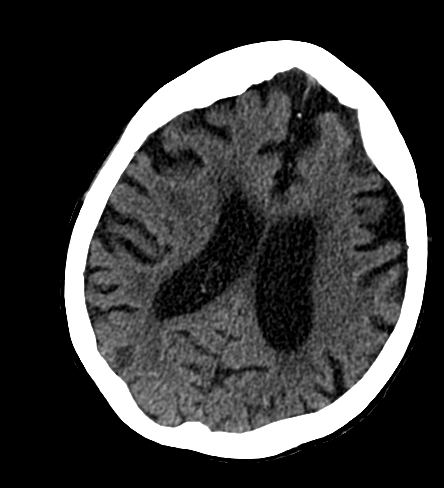
[im 21/33  brain]
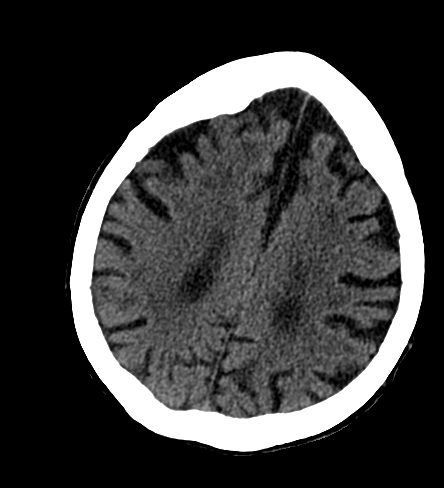
[im 21/33  bone]
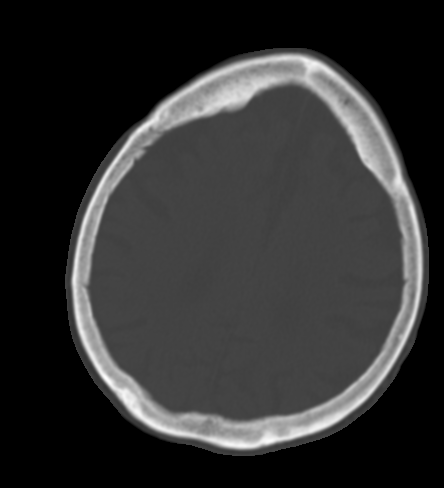
[im 23/33  brain]
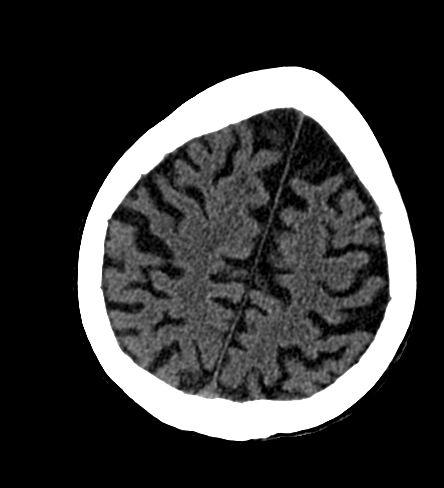
[im 26/33  brain]
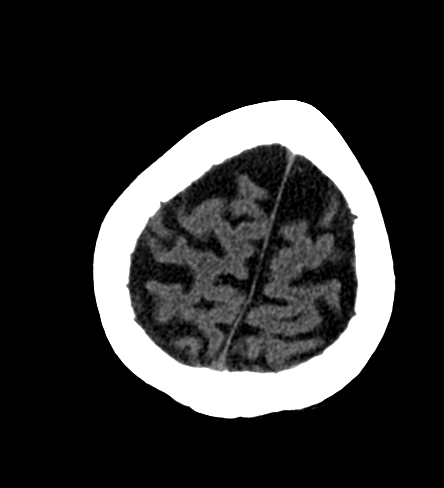
[im 28/33  brain]
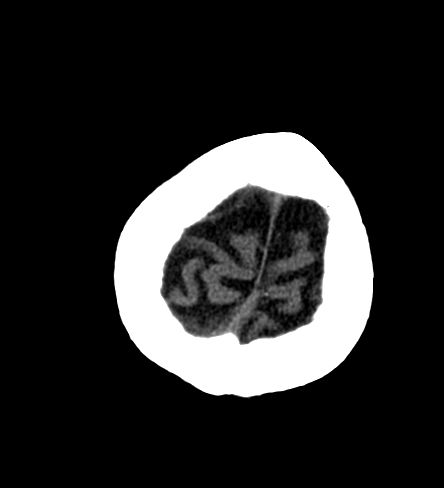
[im 30/33  brain]
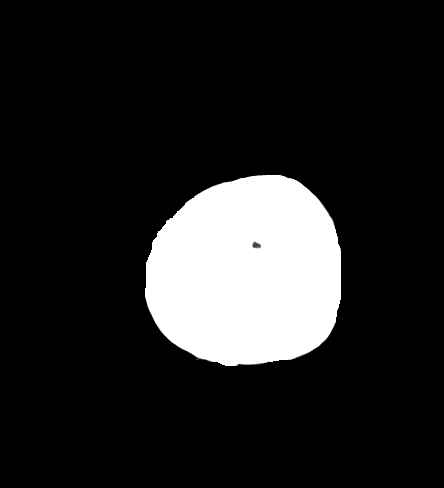
[im 30/33  bone]
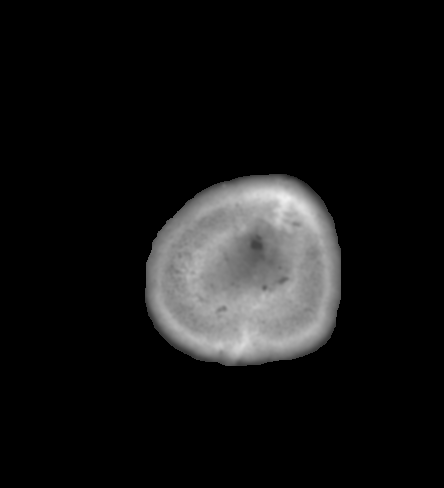

[Series 204: sagittal st, idose (1) · sagittal · 0.40mm/px · 3 of 61 slices shown]
[im 21/61  brain]
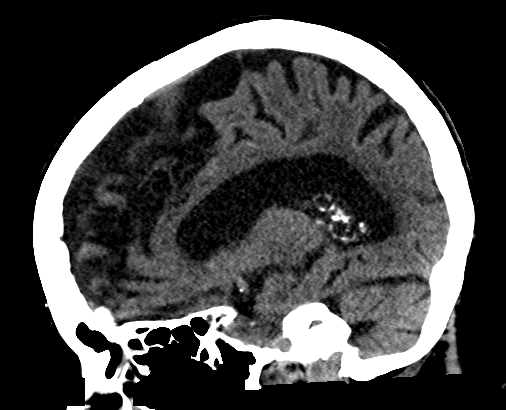
[im 31/61  brain]
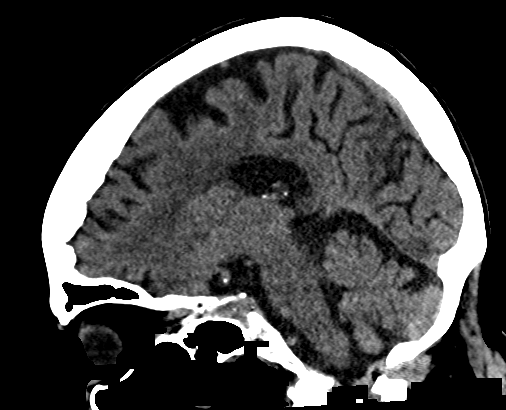
[im 41/61  brain]
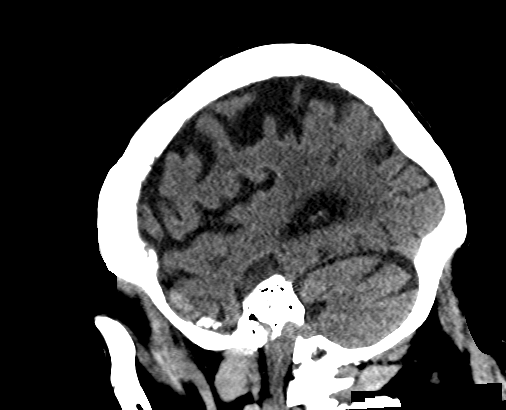

[Series 205: coronal st, idose (1) · coronal · 0.40mm/px · 3 of 46 slices shown]
[im 16/46  brain]
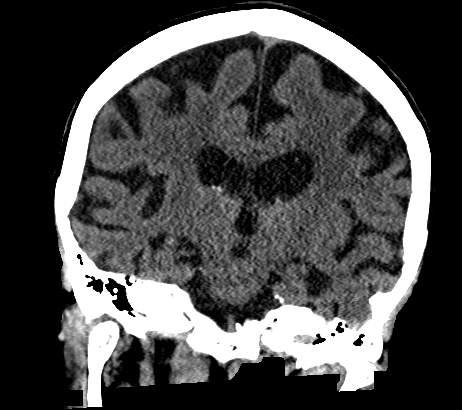
[im 21/46  brain]
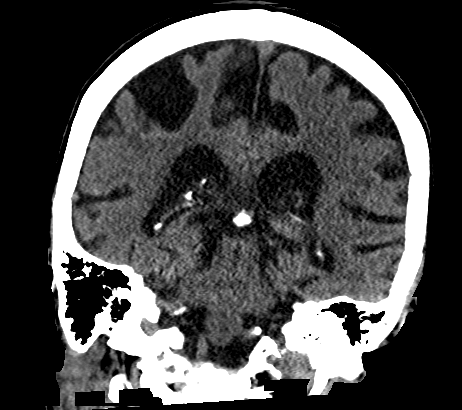
[im 26/46  brain]
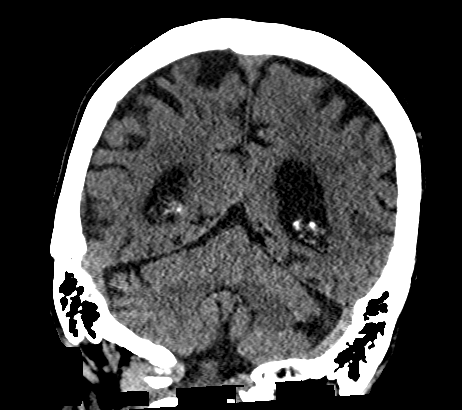

[19 of 47 positions shown; findings below may reference images not displayed]

FINDINGS: Brain: No intracranial hemorrhage or CT evidence of large acute
infarct.

Moderate chronic microvascular changes.

Moderate global atrophy without hydrocephalus.

Pituitary mass larger on the right suspicious for macro adenoma
without compression of the optic chiasm or optic nerves.

Vascular: Vascular calcifications

Skull: No skull fracture

Sinuses/Orbits: No acute orbital abnormality post lens replacement.

Mucosal thickening ethmoid sinuses bilaterally.

Other: Degenerative changes upper cervical spine with spinal
stenosis.
IMPRESSION: No intracranial hemorrhage or CT evidence of large acute infarct.

Moderate chronic microvascular changes.

Moderate global atrophy.

Pituitary mass larger on the right suspicious for macro adenoma
without compression of the optic chiasm or optic nerves.

Mucosal thickening ethmoid sinuses bilaterally.

Degenerative changes upper cervical spine with spinal stenosis.
